# Patient Record
Sex: Female | Born: 1940 | Race: White | Hispanic: No | Smoking: Former smoker
Health system: Southern US, Community
[De-identification: ages and names within clinical notes are randomized; demographics above are authoritative.]

## PROBLEM LIST (undated history)

## (undated) DIAGNOSIS — H269 Unspecified cataract: Secondary | ICD-10-CM

## (undated) DIAGNOSIS — E559 Vitamin D deficiency, unspecified: Secondary | ICD-10-CM

## (undated) DIAGNOSIS — M069 Rheumatoid arthritis, unspecified: Secondary | ICD-10-CM

## (undated) DIAGNOSIS — R7303 Prediabetes: Secondary | ICD-10-CM

## (undated) DIAGNOSIS — E785 Hyperlipidemia, unspecified: Secondary | ICD-10-CM

## (undated) DIAGNOSIS — E079 Disorder of thyroid, unspecified: Secondary | ICD-10-CM

## (undated) DIAGNOSIS — I10 Essential (primary) hypertension: Secondary | ICD-10-CM

## (undated) DIAGNOSIS — T7840XA Allergy, unspecified, initial encounter: Secondary | ICD-10-CM

## (undated) HISTORY — PX: TUBAL LIGATION: SHX77

## (undated) HISTORY — DX: Essential (primary) hypertension: I10

## (undated) HISTORY — DX: Allergy, unspecified, initial encounter: T78.40XA

## (undated) HISTORY — DX: Unspecified cataract: H26.9

## (undated) HISTORY — DX: Disorder of thyroid, unspecified: E07.9

## (undated) HISTORY — DX: Prediabetes: R73.03

## (undated) HISTORY — PX: OTHER SURGICAL HISTORY: SHX169

## (undated) HISTORY — PX: CHOLECYSTECTOMY: SHX55

## (undated) HISTORY — DX: Vitamin D deficiency, unspecified: E55.9

## (undated) HISTORY — DX: Hyperlipidemia, unspecified: E78.5

## (undated) HISTORY — PX: APPENDECTOMY: SHX54

## (undated) HISTORY — DX: Rheumatoid arthritis, unspecified: M06.9

---

## 1997-10-27 ENCOUNTER — Other Ambulatory Visit: Admission: RE | Admit: 1997-10-27 | Discharge: 1997-10-27 | Payer: Self-pay | Admitting: *Deleted

## 1997-10-27 ENCOUNTER — Other Ambulatory Visit: Admission: RE | Admit: 1997-10-27 | Discharge: 1997-10-27 | Payer: Self-pay | Admitting: Internal Medicine

## 1997-11-16 ENCOUNTER — Ambulatory Visit (HOSPITAL_BASED_OUTPATIENT_CLINIC_OR_DEPARTMENT_OTHER): Admission: RE | Admit: 1997-11-16 | Discharge: 1997-11-16 | Payer: Self-pay | Admitting: Otolaryngology

## 1998-11-03 ENCOUNTER — Other Ambulatory Visit: Admission: RE | Admit: 1998-11-03 | Discharge: 1998-11-03 | Payer: Self-pay | Admitting: Internal Medicine

## 1998-11-10 ENCOUNTER — Ambulatory Visit (HOSPITAL_COMMUNITY): Admission: RE | Admit: 1998-11-10 | Discharge: 1998-11-10 | Payer: Self-pay | Admitting: Psychiatry

## 1999-04-08 ENCOUNTER — Other Ambulatory Visit: Admission: RE | Admit: 1999-04-08 | Discharge: 1999-04-08 | Payer: Self-pay | Admitting: Orthopedic Surgery

## 1999-11-21 ENCOUNTER — Other Ambulatory Visit: Admission: RE | Admit: 1999-11-21 | Discharge: 1999-11-21 | Payer: Self-pay | Admitting: *Deleted

## 2000-10-05 ENCOUNTER — Encounter: Payer: Self-pay | Admitting: Gastroenterology

## 2000-10-05 ENCOUNTER — Ambulatory Visit (HOSPITAL_COMMUNITY): Admission: RE | Admit: 2000-10-05 | Discharge: 2000-10-05 | Payer: Self-pay | Admitting: Gastroenterology

## 2000-10-08 ENCOUNTER — Encounter: Payer: Self-pay | Admitting: Surgery

## 2000-10-10 ENCOUNTER — Ambulatory Visit (HOSPITAL_COMMUNITY): Admission: RE | Admit: 2000-10-10 | Discharge: 2000-10-11 | Payer: Self-pay | Admitting: Surgery

## 2000-10-10 ENCOUNTER — Encounter: Payer: Self-pay | Admitting: Surgery

## 2000-10-10 ENCOUNTER — Encounter (INDEPENDENT_AMBULATORY_CARE_PROVIDER_SITE_OTHER): Payer: Self-pay | Admitting: *Deleted

## 2000-12-27 ENCOUNTER — Other Ambulatory Visit: Admission: RE | Admit: 2000-12-27 | Discharge: 2000-12-27 | Payer: Self-pay | Admitting: *Deleted

## 2001-12-31 ENCOUNTER — Encounter: Payer: Self-pay | Admitting: Internal Medicine

## 2001-12-31 ENCOUNTER — Other Ambulatory Visit: Admission: RE | Admit: 2001-12-31 | Discharge: 2001-12-31 | Payer: Self-pay | Admitting: Internal Medicine

## 2001-12-31 ENCOUNTER — Ambulatory Visit (HOSPITAL_COMMUNITY): Admission: RE | Admit: 2001-12-31 | Discharge: 2001-12-31 | Payer: Self-pay | Admitting: Internal Medicine

## 2002-05-29 HISTORY — PX: ANKLE ARTHROSCOPY WITH OPEN REDUCTION INTERNAL FIXATION (ORIF): SHX5582

## 2002-06-04 ENCOUNTER — Ambulatory Visit (HOSPITAL_COMMUNITY): Admission: RE | Admit: 2002-06-04 | Discharge: 2002-06-04 | Payer: Self-pay | Admitting: Internal Medicine

## 2002-06-04 ENCOUNTER — Encounter: Payer: Self-pay | Admitting: Internal Medicine

## 2003-03-09 ENCOUNTER — Ambulatory Visit (HOSPITAL_COMMUNITY): Admission: RE | Admit: 2003-03-09 | Discharge: 2003-03-10 | Payer: Self-pay | Admitting: Orthopedic Surgery

## 2003-03-09 ENCOUNTER — Encounter: Payer: Self-pay | Admitting: Orthopedic Surgery

## 2004-01-06 ENCOUNTER — Other Ambulatory Visit: Admission: RE | Admit: 2004-01-06 | Discharge: 2004-01-06 | Payer: Self-pay | Admitting: Internal Medicine

## 2005-05-29 HISTORY — PX: COCHLEAR IMPLANT: SUR684

## 2006-01-22 ENCOUNTER — Other Ambulatory Visit: Admission: RE | Admit: 2006-01-22 | Discharge: 2006-01-22 | Payer: Self-pay | Admitting: Family Medicine

## 2008-01-30 ENCOUNTER — Ambulatory Visit (HOSPITAL_COMMUNITY): Admission: RE | Admit: 2008-01-30 | Discharge: 2008-01-30 | Payer: Self-pay | Admitting: Internal Medicine

## 2008-03-04 ENCOUNTER — Ambulatory Visit (HOSPITAL_COMMUNITY): Admission: RE | Admit: 2008-03-04 | Discharge: 2008-03-04 | Payer: Self-pay | Admitting: Internal Medicine

## 2008-03-18 ENCOUNTER — Ambulatory Visit (HOSPITAL_COMMUNITY): Admission: RE | Admit: 2008-03-18 | Discharge: 2008-03-18 | Payer: Self-pay | Admitting: Internal Medicine

## 2008-05-29 LAB — HM COLONOSCOPY

## 2008-09-15 ENCOUNTER — Encounter: Admission: RE | Admit: 2008-09-15 | Discharge: 2008-09-15 | Payer: Self-pay | Admitting: Internal Medicine

## 2008-10-14 ENCOUNTER — Emergency Department (HOSPITAL_COMMUNITY): Admission: EM | Admit: 2008-10-14 | Discharge: 2008-10-15 | Payer: Self-pay | Admitting: Emergency Medicine

## 2008-10-20 ENCOUNTER — Encounter: Admission: RE | Admit: 2008-10-20 | Discharge: 2008-10-20 | Payer: Self-pay | Admitting: Orthopaedic Surgery

## 2008-10-23 ENCOUNTER — Encounter: Admission: RE | Admit: 2008-10-23 | Discharge: 2008-10-23 | Payer: Self-pay | Admitting: Orthopaedic Surgery

## 2008-10-27 ENCOUNTER — Emergency Department (HOSPITAL_COMMUNITY): Admission: EM | Admit: 2008-10-27 | Discharge: 2008-10-27 | Payer: Self-pay | Admitting: Emergency Medicine

## 2009-03-23 ENCOUNTER — Other Ambulatory Visit: Admission: RE | Admit: 2009-03-23 | Discharge: 2009-03-23 | Payer: Self-pay | Admitting: Internal Medicine

## 2009-03-23 ENCOUNTER — Ambulatory Visit (HOSPITAL_COMMUNITY): Admission: RE | Admit: 2009-03-23 | Discharge: 2009-03-23 | Payer: Self-pay | Admitting: Internal Medicine

## 2009-04-28 ENCOUNTER — Encounter: Admission: RE | Admit: 2009-04-28 | Discharge: 2009-04-28 | Payer: Self-pay | Admitting: Orthopedic Surgery

## 2010-02-01 ENCOUNTER — Ambulatory Visit (HOSPITAL_COMMUNITY): Admission: RE | Admit: 2010-02-01 | Discharge: 2010-02-01 | Payer: Self-pay | Admitting: Internal Medicine

## 2010-02-01 ENCOUNTER — Ambulatory Visit: Payer: Self-pay

## 2010-02-01 ENCOUNTER — Ambulatory Visit: Payer: Self-pay | Admitting: Cardiology

## 2010-02-01 ENCOUNTER — Encounter: Payer: Self-pay | Admitting: Internal Medicine

## 2010-03-30 ENCOUNTER — Inpatient Hospital Stay (HOSPITAL_COMMUNITY)
Admission: RE | Admit: 2010-03-30 | Discharge: 2010-03-31 | Payer: Self-pay | Source: Home / Self Care | Admitting: Obstetrics & Gynecology

## 2010-05-29 LAB — HM PAP SMEAR: HM Pap smear: NORMAL

## 2010-08-09 LAB — BASIC METABOLIC PANEL
BUN: 10 mg/dL (ref 6–23)
Chloride: 102 mEq/L (ref 96–112)
GFR calc Af Amer: >60 mL/min (ref >60)
GFR calc non Af Amer: >60 mL/min (ref >60)
Glucose, Bld: 126 mg/dL — ABNORMAL HIGH (ref 70–99)
Potassium: 3.6 mEq/L (ref 3.5–5.1)
Sodium: 137 mEq/L (ref 135–145)

## 2010-08-09 LAB — CBC: RBC: 3.31 MIL/uL — ABNORMAL LOW (ref 3.87–5.11)

## 2010-08-10 LAB — SURGICAL PCR SCREEN: MRSA, PCR: NEGATIVE

## 2010-08-10 LAB — CBC
HCT: 38.8 % (ref 36.0–46.0)
MCH: 30.9 pg (ref 26.0–34.0)
MCHC: 33.8 g/dL (ref 30.0–36.0)
MCV: 91.3 fL (ref 78.0–100.0)
Platelets: 290 10*3/uL (ref 150–400)
RDW: 16.7 % — ABNORMAL HIGH (ref 11.5–15.5)

## 2010-08-10 LAB — BASIC METABOLIC PANEL
BUN: 16 mg/dL (ref 6–23)
Calcium: 9.3 mg/dL (ref 8.4–10.5)
Chloride: 99 mEq/L (ref 96–112)
GFR calc Af Amer: >60 mL/min (ref >60)

## 2010-09-05 LAB — DIFFERENTIAL
Basophils Absolute: 0.1 10*3/uL (ref 0.0–0.1)
Basophils Relative: 1 % (ref 0–1)
Eosinophils Absolute: 0.1 10*3/uL (ref 0.0–0.7)
Lymphs Abs: 1.8 10*3/uL (ref 0.7–4.0)
Monocytes Absolute: 0.7 10*3/uL (ref 0.1–1.0)
Monocytes Relative: 6 % (ref 3–12)
Neutrophils Relative %: 77 % (ref 43–77)

## 2010-09-05 LAB — URINALYSIS, ROUTINE W REFLEX MICROSCOPIC
Bilirubin Urine: NEGATIVE
Glucose, UA: NEGATIVE mg/dL
Hgb urine dipstick: NEGATIVE
Protein, ur: NEGATIVE mg/dL
Specific Gravity, Urine: 1.016 (ref 1.005–1.030)

## 2010-09-05 LAB — COMPREHENSIVE METABOLIC PANEL
ALT: 23 U/L (ref 0–35)
Albumin: 2.3 g/dL — ABNORMAL LOW (ref 3.5–5.2)
Calcium: 9.2 mg/dL (ref 8.4–10.5)
Chloride: 94 mEq/L — ABNORMAL LOW (ref 96–112)
GFR calc Af Amer: >60 mL/min (ref >60)
Glucose, Bld: 115 mg/dL — ABNORMAL HIGH (ref 70–99)
Total Protein: 6.9 g/dL (ref 6.0–8.3)

## 2010-09-05 LAB — CBC
HCT: 32.3 % — ABNORMAL LOW (ref 36.0–46.0)
MCHC: 33.4 g/dL (ref 30.0–36.0)
Platelets: 777 10*3/uL — ABNORMAL HIGH (ref 150–400)

## 2010-09-06 LAB — POCT I-STAT, CHEM 8
BUN: 21 mg/dL (ref 6–23)
Calcium, Ion: 1.19 mmol/L (ref 1.12–1.32)
Chloride: 98 mEq/L (ref 96–112)
Glucose, Bld: 155 mg/dL — ABNORMAL HIGH (ref 70–99)
HCT: 33 % — ABNORMAL LOW (ref 36.0–46.0)
Potassium: 3.1 mEq/L — ABNORMAL LOW (ref 3.5–5.1)
Sodium: 138 mEq/L (ref 135–145)
TCO2: 31 mmol/L (ref 0–100)

## 2010-09-06 LAB — BASIC METABOLIC PANEL WITH GFR
BUN: 20 mg/dL (ref 6–23)
Chloride: 99 meq/L (ref 96–112)
GFR calc non Af Amer: >60 mL/min (ref >60)
Potassium: 3.1 meq/L — ABNORMAL LOW (ref 3.5–5.1)
Sodium: 138 meq/L (ref 135–145)

## 2010-09-06 LAB — CK: Total CK: 98 U/L (ref 7–177)

## 2010-09-06 LAB — BASIC METABOLIC PANEL
CO2: 30 mEq/L (ref 19–32)
Calcium: 9.3 mg/dL (ref 8.4–10.5)
Creatinine, Ser: 0.85 mg/dL (ref 0.4–1.2)
GFR calc Af Amer: >60 mL/min (ref >60)
Glucose, Bld: 170 mg/dL — ABNORMAL HIGH (ref 70–99)

## 2010-10-14 NOTE — Procedures (Signed)
[place]. [facility]  Patient:    Angelica Parker, Angelica Parker                       MRN: [id number] Proc. Date: 10/05/00 Adm. Date:  [id number] Disc. Date: [id number] Attending:  Felicita Gage CC:         Ammie Dalton, M.D.  Sandria Bales. Ezzard Standing, M.D.   Procedure Report  PROCEDURE:  Endoscopic retrograde cholangiopancreatography with sphincterotomy.  SURGEON:  John C. Madilyn Fireman, M.D.  INDICATIONS FOR PROCEDURE:  Gallstones with suspected common bile duct stones with recent bilirubin of 5.  DESCRIPTION OF PROCEDURE:  The patient was placed in the prone position and placed on the pulse monitor with continuous low flow oxygen delivered by nasal cannula.  She was sedated with 70 mg of IV Demerol and 7 mg of IV Versed.  The Olympus side viewing endoscope was advanced blindly into the oropharynx, esophagus, and stomach.  No obvious abnormalities were noted in the esophagus or stomach.  The pylorus was traversed and papilla of Vater located on the medial duodenal wall.  It had a normal appearance and was cannulated with a Montez Morita sphincterotome.  A cholangiogram was obtained with shallow cannulation and this revealed a fairly normal caliber common bile duct.  The guidewire passed easily up into the duct and this was followed with the sphincterotome.  Dr. Ronney Asters was in attendance and assisted in reviewing the cholangiogram.  No pancreatic duct injection was performed.  The gallbladder did fill and showed multiple small stones.  No obvious common bile duct filling defects were seen, but due to the small size of the gallstones, as well as elevated bilirubin, I decided to perform an empiric sphincterotomy.  After this was done, an 8.5 mm balloon catheter was advanced into the common hepatic duct, inflated, and pulled down under fluoroscopic and endoscopic visualization with no stones or stone fragments seen to be delivered.  The scope was then withdrawn and the patient  returned to the recovery room in stable condition.  She tolerated the procedure well and there were no immediate complications.  IMPRESSION:  Gallstones with no visible common bile duct stones at present, suspect previous stone passed.  PLAN:  Proceed to laparoscopic cholecystectomy. DD:  10/05/00 TD:  10/07/00 Job: [id number]

## 2010-10-14 NOTE — Op Note (Signed)
NAME:  Angelica Parker, Angelica Parker                          ACCOUNT NO.:  [id number]   MEDICAL RECORD NO.:  [id number]                   PATIENT TYPE:  OIB   LOCATION:  2853                                 FACILITY:  [facility]   PHYSICIAN:  Burnard Bunting, M.D.                 DATE OF BIRTH:  [birth year]   DATE OF PROCEDURE:  03/09/2003  DATE OF DISCHARGE:                                 OPERATIVE REPORT   PREOPERATIVE DIAGNOSIS:  Right trimalleolar ankle fracture.   POSTOPERATIVE DIAGNOSIS:  Right trimalleolar ankle fracture.   PROCEDURE:  Right ankle open reduction internal fixation without fixation of  the posterior malleolar fracture.   SURGEON:  Burnard Bunting, M.D.   ANESTHESIA:  General endotracheal.   ESTIMATED BLOOD LOSS:  25 cc.   DRAINS:  None.   ANKLE ESMARCH:  50 minutes.   PROCEDURE IN DETAIL:  The patient was brought to the operating room where  general endotracheal anesthesia was induced.  Preoperative IV antibiotics  were administered.  The right leg was prepped with Duraprep solution and  draped in a sterile manner.  The operative field was covered with Ioban.  Ankle Esmarch was utilized at the mid calf level.  A lateral approach to the  lateral malleolus was made, stopped short of the 9 cm mark proximal to the  distal tip of the fibula.  The skin and subcutaneous tissue were sharply  divided.  The periosteum was elevated from around the fracture site.  The  fracture was reduced under direct visualization.  A lag screw was placed  from proximal anterior to distal posterior.  Good reduction and compression  of the oblique fracture was achieved.  A six hole one-third tubular locking  plate was then applied with excellent stabilization achieved.  At this time,  the medial side was approached with a 4 cm incision.  The skin and  subcutaneous tissue was sharply divided.  The fracture site was essentially  nondisplaced.  It was held in compression with a dental pick.  Two 50 mm  4.0  mm cannulated screws were then placed.  Excellent fixation and compression  was achieved.  Reduction was viewed in AP and lateral planes under  fluoroscopy.  The syndesmosis was then tested with lateral force against the  calcaneus and under fluoroscopic inspection, the syndesmosis was stable at  this time.  The tourniquet was released.  Bleeding points were encountered  and stopped using electrocautery.  Both incisions were irrigated and closed  using interrupted inverted 2-0 Vicryl suture and 3-0 nylon suture.  The  patient was placed in a bulky posterior splint with the foot in neutral  dorsiflexion.  The heel was well padded.  Burnard Bunting, M.D.    GSD/MEDQ  D:  03/09/2003  T:  03/10/2003  Job:  [id number]

## 2010-10-14 NOTE — Op Note (Signed)
[place]. [facility]  Patient:    Angelica Parker, Angelica Parker                      MRN: [id number] Proc. Date: 10/10/00 Attending:  Sandria Bales. Ezzard Standing, M.D. CC:         Ammie Dalton, M.D.  John C. Madilyn Fireman, M.D.   Operative Report  DATE OF BIRTH:  [birth year]  PREOPERATIVE DIAGNOSIS:  Chronic cholecystitis and cholelithiasis.  POSTOPERATIVE DIAGNOSIS:  Chronic cholecystitis with cholelithiasis.  OPERATION:  Laparoscopic cholecystectomy with intraoperative cholangiogram.  SURGEON:  Sandria Bales. Ezzard Standing, M.D.  ASSISTANT:  Donnie Coffin. Samuella Cota, M.D.  ANESTHESIA:  General endotracheal.  ESTIMATED BLOOD LOSS:  Minimal.  INDICATIONS:  Ms. Tirone is a 70 year old white female, patient of Dr. Ammie Dalton who presented with evidence of cholelithiasis and chronic cholecystitis by both ultrasound and physical exam.  She also is noted to have elevated liver functions with a bilirubin as high as 5.2.  I referred her to Dr. Dorena Cookey who did an ERCP on Friday, Oct 05, 2000, which is reported as no obvious common bile duct stones.  She now comes for laparoscopic cholecystectomy.  The indications and complications of the procedure were explained to the patient.  DESCRIPTION OF PROCEDURE:  The patient was placed in the supine position.  She was given a general endotracheal anesthetic.  Her abdomen was prepped with Betadine solution and sterilely draped.  An infraumbilical incision was made with sharp dissection carried down into the abdominal cavity.  A 0 degree 10 mm laparoscope was inserted into the abdominal cavity through a 12 mm Hasson trocar.  The Hasson trocar was secured with a 0 Vicryl suture. The abdomen was insufflated with CO2 to about 4 liters.  Abdominal exploration revealed mainly just omentum which covered the entire abdominal cavity.  The right and left lobes of the liver were unremarkable.  The stomach was unremarkable.  I really could not see any  other abdominal structures, but there was no nodularity or other mass noted.  Three additional trocars were placed at 10 mm, subxiphoid, trocar and two 5 mm trocars, one in the right mid subcostal location and one in the right lateral subcostal location.  The gallbladder was identified and noted to have adhesions from the omentum and duodenum up the entire length of the gallbladder.  These were taken down bluntly and taken without any difficulty. I got down to the gallbladder cystic duct junction.  The patient had a prominent large cystic duct of which I was able to tease away.  I identified the length, probably about 3-4 cm in length.  I then shot an intraoperative cholangiogram.  Intraoperative cholangiogram was obtained using a cut off taut catheter inserted through a 14 gauge Jelco catheter into the abdominal cavity.  The taut catheter was inserted into the side of the cut cystic duct, and the intraoperative cholangiogram obtained under fluoroscopy.  Using about 8 cc of Hypaque solution, I injected Hypaque through the taut catheter into the cystic duct.  It flowed freely into the common bile duct, down to the duodenum and back up into the liver.  There was no obstruction, no mass and no filling defect.  This was felt to be a normal intraoperative cholangiogram.  The cystic duct catheter was then removed.  The cystic artery had been identified, triply endoclipped and divided.  The gallbladder was then sharply and bluntly dissected from the gallbladder bed.  Prior to complete division  of the gallbladder from the gallbladder bed, I went back and looked at the triangle of Calot and the gallbladder bed.  There was no bleeding, no bile leak.  The gallbladder was divided from the liver and delivered through the umbilicus intact.  I opened it for a stone to return to patient.  I then removed each trocar was then removed under direct visualization.  The umbilical trocar site was closed  with a 0 Vicryl suture.  The epitrochlear sites were note closed.  They had no bleeding and no leak.  The skin incision site was closed with 5-0 Vicryl suture.  The skin was then painted with tincture of Benzoin, steri-stripped and sterilely dressed.  The patient tolerated the procedure well and was transported to the recovery room in stable good condition.  The patient did have several stones packed in the cystic duct which I identified, milked out and retrieved and the cystic duct was somewhat enlarged but again, the intraoperative cholangiogram was unremarkable. DD:  10/10/00 TD:  10/10/00 Job: [id number]

## 2011-03-17 DIAGNOSIS — H9193 Unspecified hearing loss, bilateral: Secondary | ICD-10-CM | POA: Insufficient documentation

## 2012-01-12 DIAGNOSIS — H906 Mixed conductive and sensorineural hearing loss, bilateral: Secondary | ICD-10-CM | POA: Insufficient documentation

## 2012-05-28 ENCOUNTER — Other Ambulatory Visit: Payer: Self-pay

## 2013-04-24 ENCOUNTER — Encounter: Payer: Self-pay | Admitting: Internal Medicine

## 2013-04-24 DIAGNOSIS — E559 Vitamin D deficiency, unspecified: Secondary | ICD-10-CM | POA: Insufficient documentation

## 2013-04-24 DIAGNOSIS — I10 Essential (primary) hypertension: Secondary | ICD-10-CM | POA: Insufficient documentation

## 2013-04-24 DIAGNOSIS — E039 Hypothyroidism, unspecified: Secondary | ICD-10-CM | POA: Insufficient documentation

## 2013-04-24 DIAGNOSIS — M069 Rheumatoid arthritis, unspecified: Secondary | ICD-10-CM | POA: Insufficient documentation

## 2013-04-24 DIAGNOSIS — R7303 Prediabetes: Secondary | ICD-10-CM | POA: Insufficient documentation

## 2013-04-24 DIAGNOSIS — E785 Hyperlipidemia, unspecified: Secondary | ICD-10-CM | POA: Insufficient documentation

## 2013-04-28 ENCOUNTER — Encounter: Payer: Self-pay | Admitting: Physician Assistant

## 2013-04-28 ENCOUNTER — Ambulatory Visit (INDEPENDENT_AMBULATORY_CARE_PROVIDER_SITE_OTHER): Payer: 59 | Admitting: Physician Assistant

## 2013-04-28 VITALS — BP 108/78 | HR 84 | Temp 98.2°F | Resp 16 | Ht 66.5 in | Wt 202.0 lb

## 2013-04-28 DIAGNOSIS — I10 Essential (primary) hypertension: Secondary | ICD-10-CM

## 2013-04-28 DIAGNOSIS — E559 Vitamin D deficiency, unspecified: Secondary | ICD-10-CM

## 2013-04-28 DIAGNOSIS — E079 Disorder of thyroid, unspecified: Secondary | ICD-10-CM

## 2013-04-28 DIAGNOSIS — N3000 Acute cystitis without hematuria: Secondary | ICD-10-CM

## 2013-04-28 DIAGNOSIS — E785 Hyperlipidemia, unspecified: Secondary | ICD-10-CM

## 2013-04-28 DIAGNOSIS — E119 Type 2 diabetes mellitus without complications: Secondary | ICD-10-CM

## 2013-04-28 LAB — CBC WITH DIFFERENTIAL/PLATELET
Basophils Absolute: 0.1 K/uL (ref 0.0–0.1)
Basophils Relative: 1 % (ref 0–1)
Eosinophils Absolute: 0.3 K/uL (ref 0.0–0.7)
Eosinophils Relative: 5 % (ref 0–5)
HCT: 39.4 % (ref 36.0–46.0)
Hemoglobin: 13.5 g/dL (ref 12.0–15.0)
Lymphocytes Relative: 46 % (ref 12–46)
Lymphs Abs: 2.6 K/uL (ref 0.7–4.0)
MCH: 32 pg (ref 26.0–34.0)
MCHC: 34.3 g/dL (ref 30.0–36.0)
MCV: 93.4 fL (ref 78.0–100.0)
Monocytes Absolute: 0.4 K/uL (ref 0.1–1.0)
Monocytes Relative: 7 % (ref 3–12)
Neutro Abs: 2.3 K/uL (ref 1.7–7.7)
Neutrophils Relative %: 41 % — ABNORMAL LOW (ref 43–77)
Platelets: 285 K/uL (ref 150–400)
RBC: 4.22 MIL/uL (ref 3.87–5.11)
RDW: 15.5 % (ref 11.5–15.5)
WBC: 5.7 K/uL (ref 4.0–10.5)

## 2013-04-28 LAB — BASIC METABOLIC PANEL WITHOUT GFR
BUN: 15 mg/dL (ref 6–23)
CO2: 30 meq/L (ref 19–32)
Calcium: 9.6 mg/dL (ref 8.4–10.5)
Chloride: 102 meq/L (ref 96–112)
Creat: 0.74 mg/dL (ref 0.50–1.10)
GFR, Est African American: >89 mL/min
GFR, Est Non African American: 81 mL/min
Glucose, Bld: 103 mg/dL — ABNORMAL HIGH (ref 70–99)
Potassium: 4.9 meq/L (ref 3.5–5.3)
Sodium: 139 meq/L (ref 135–145)

## 2013-04-28 LAB — HEMOGLOBIN A1C
Hgb A1c MFr Bld: 6.1 % — ABNORMAL HIGH (ref <5.7)
Mean Plasma Glucose: 128 mg/dL — ABNORMAL HIGH (ref <117)

## 2013-04-28 LAB — LIPID PANEL
Cholesterol: 169 mg/dL (ref 0–200)
HDL: 45 mg/dL
LDL Cholesterol: 95 mg/dL (ref 0–99)
Total CHOL/HDL Ratio: 3.8 ratio
Triglycerides: 143 mg/dL
VLDL: 29 mg/dL (ref 0–40)

## 2013-04-28 LAB — HEPATIC FUNCTION PANEL
ALT: 32 U/L (ref 0–35)
AST: 22 U/L (ref 0–37)
Albumin: 3.9 g/dL (ref 3.5–5.2)
Alkaline Phosphatase: 80 U/L (ref 39–117)
Total Bilirubin: 0.6 mg/dL (ref 0.3–1.2)
Total Protein: 6.5 g/dL (ref 6.0–8.3)

## 2013-04-28 LAB — TSH: TSH: 0.352 u[IU]/mL (ref 0.350–4.500)

## 2013-04-28 MED ORDER — CLOTRIMAZOLE 1 % VA CREA: 1.0000 | TOPICAL_CREAM | Freq: Every day | VAGINAL | Status: DC

## 2013-04-28 NOTE — Progress Notes (Signed)
Complete Physical HPI Patient presents for complete physical.   Patient's blood pressure has been controlled at home. Patient denies chest pain, shortness of breath, dizziness. Patient's cholesterol is diet controlled.  The patient's cholesterol last visit was LDL 97.  The patient has been working on diet and exercise for prediabetes, denies changes in vision, polys, and paresthesias. Last A1C in office was 6.2. Vitamin d last visit was 65. Patient sees Dr. Dareen Piano and states her RA is doing well.  Patient complains of vaginal itching mainly at night, no burning when she pees and no discharge.   Current Medications:  Current Outpatient Prescriptions on File Prior to Visit  Medication Sig Dispense Refill  . adalimumab (HUMIRA) 40 MG/0.8ML injection Inject 40 mg into the skin every 14 (fourteen) days.      Marland Kitchen aspirin 81 MG chewable tablet Chew by mouth daily.      . bisoprolol-hydrochlorothiazide (ZIAC) 5-6.25 MG per tablet Take 1 tablet by mouth daily.      . calcium carbonate (OS-CAL) 600 MG TABS tablet Take 600 mg by mouth 2 (two) times daily with a meal.      . Cholecalciferol (VITAMIN D PO) Take 4,000 Int'l Units by mouth daily.      . citalopram (CELEXA) 40 MG tablet Take 40 mg by mouth daily.      . folic acid (FOLVITE) 1 MG tablet Take by mouth daily.      Marland Kitchen levothyroxine (SYNTHROID, LEVOTHROID) 112 MCG tablet Take 112 mcg by mouth daily before breakfast.      . losartan (COZAAR) 100 MG tablet Take 100 mg by mouth daily.      . Magnesium 400 MG CAPS Take 400 mg by mouth 2 (two) times daily.      . methotrexate (RHEUMATREX) 2.5 MG tablet Take 2.5 mg by mouth.      . Multiple Vitamins-Minerals (MULTIVITAMIN PO) Take by mouth daily.      . Omega-3 Fatty Acids (FISH OIL PO) Take by mouth daily.       No current facility-administered medications on file prior to visit.   Health Maintenance:  Tetanus: 2007 Pneumovax: Allergy Flu vaccine: 02/04/2013 Zostavax:2008 Pap: 2012 neg MGM:  03/05/2013 neg DEXA: 2013 nl Colonoscopy: 2010 + tics due 2020  EGD: N/A Allergies:  Allergies  Allergen Reactions  . Ace Inhibitors Cough  . Pneumococcal Vaccines    Medical History:  Past Medical History  Diagnosis Date  . Hyperlipidemia   . Hypertension   . Prediabetes   . Thyroid disease   . Vitamin D deficiency   . Allergy   . Rheumatoid arthritis    Surgical History:  Past Surgical History  Procedure Laterality Date  . Cochlear implant Right 2007  . Appendectomy    . Tubal ligation Bilateral   . Cholecystectomy    . Other surgical history      4 ear surgeries  . Ankle arthroscopy with open reduction internal fixation (orif) Right 2004  . Other surgical history      Right temple bone surgery   Family History:  Family History  Problem Relation Age of Onset  . Heart attack Mother   . Hypertension Mother   . Lymphoma Mother    Social History:  History   Social History  . Marital Status: Widowed    Spouse Name: N/A    Number of Children: N/A  . Years of Education: N/A   Occupational History  . Not on file.   Social History Main  Topics  . Smoking status: Former Games developer  . Smokeless tobacco: Never Used  . Alcohol Use: Not on file  . Drug Use: No  . Sexual Activity: Not on file   Other Topics Concern  . Not on file   Social History Narrative  . No narrative on file   ROS Constitutional: Denies weight loss/gain, headaches, insomnia, fatigue, night sweats, and change in appetite. Eyes: Denies redness, blurred vision, diplopia, discharge, itchy, watery eyes.  ENT: Denies discharge, congestion, post nasal drip, sore throat, earache, hearing loss, dental pain, Tinnitus, Vertigo, Sinus pain, snoring.  Cardio: Denies chest pain, palpitations, irregular heartbeat, dyspnea, diaphoresis, orthopnea, PND, claudication, edema Respiratory: denies cough, dyspnea, pleurisy, hoarseness, wheezing.  Gastrointestinal: Occ diarrhea is goes out to eat Denies dysphagia,  heartburn, pain, cramps, nausea, vomiting, bloating, diarrhea, constipation, hematemesis, melena, hematochezia, hemorrhoids Genitourinary: + vaginal itching Denies dysuria, frequency, urgency, nocturia, hesitancy, discharge, hematuria, flank pain Breast:Denies Breast lumps, nipple discharge, bleeding.  Musculoskeletal: Denies arthralgia, myalgia, stiffness, Jt. Swelling, pain, Skin: Denies pruritis, rash, hives,  acne, eczema, changing in skin lesion Neuro: Denies Weakness, tremor, incoordination, spasms, paresthesia, pain Psychiatric: Denies confusion, memory loss, sensory loss Endocrine: Denies change in weight, skin, hair change, nocturia, and paresthesia, Diabetic Denies Polys, visual blurring, hyper /hypo glycemic episodes.  Heme/Lymph: Denies Excessive bleeding, bruising, enlarged lymph nodes  Physical Exam: Estimated body mass index is 32.12 kg/(m^2) as calculated from the following:   Height as of this encounter: 5' 6.5" (1.689 m).   Weight as of this encounter: 202 lb (91.627 kg). Filed Vitals:   04/28/13 1011  BP: 108/78  Pulse: 84  Temp: 98.2 F (36.8 C)  Resp: 16   General Appearance: Well nourished, in no apparent distress. Eyes: PERRLA, EOMs, conjunctiva no swelling or erythema, normal fundi and vessels. Sinuses: No Frontal/maxillary tenderness ENT/Mouth: Ext aud canals clear, normal light reflex with TMs without erythema, bulging on Left side and right side has scarred TM. + Dentures. No erythema, swelling, or exudate on post pharynx. Tonsils not swollen or erythematous. 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 No bruits Respiratory: Respiratory effort normal, BS equal bilaterally without rales, rhonchi, wheezing or stridor. Cardio: Heart sounds normal, regular rate and rhythm without murmurs, rubs or gallops. Peripheral pulses brisk and equal bilaterally, without edema.  Chest: symmetric, with normal excursions and  percussion. Breasts: defer  Abdomen: Flat, soft, with bowl sounds. Non tender, no guarding, rebound, hernias, masses, or organomegaly. .  Lymphatics: Non tender without lymphadenopathy.  Genitourinary: defer Musculoskeletal: Full ROM all peripheral extremities,5/5 strength, and normal gait. Skin: Warm, dry without rashes, lesions, ecchymosis.  Neuro: Cranial nerves intact, reflexes equal bilaterally. Normal muscle tone, no cerebellar symptoms. Sensation intact.  Psych: Awake and oriented X 3, normal affect, Insight and Judgment appropriate.   EKG: WNL no changes.  Assessment and Plan: Marland Kitchen Hyperlipidemia- check lipids  . Hypertension- check microalbumin and cont meds  . Prediabetes/DM- continue weight loss  . Thyroid disease- check level continue meds  . Vitamin D deficiency- continue Vitamin D supplement  . Allergy- controlled  . Rheumatoid arthritis- Sees Dr. Dareen Piano Thursday  Depression- on citalopram and feels it helps Recent UTI- will recheck urine. Vaginitis- clotrimazole vaginal sent in, if it continues or does not help schedule a separate OV.    Quentin Mulling 10:16 AM

## 2013-04-29 LAB — URINALYSIS, ROUTINE W REFLEX MICROSCOPIC
Bilirubin Urine: NEGATIVE
Glucose, UA: NEGATIVE mg/dL
Nitrite: NEGATIVE
Protein, ur: NEGATIVE mg/dL
Specific Gravity, Urine: 1.017 (ref 1.005–1.030)
Urobilinogen, UA: 0.2 mg/dL (ref 0.0–1.0)

## 2013-04-29 LAB — MICROALBUMIN / CREATININE URINE RATIO
Creatinine, Urine: 106.5 mg/dL
Microalb, Ur: 0.5 mg/dL (ref 0.00–1.89)

## 2013-04-29 LAB — URINE CULTURE
Colony Count: NO GROWTH
Organism ID, Bacteria: NO GROWTH

## 2013-07-17 ENCOUNTER — Other Ambulatory Visit: Payer: Self-pay | Admitting: Internal Medicine

## 2013-07-17 ENCOUNTER — Other Ambulatory Visit: Payer: Self-pay | Admitting: Physician Assistant

## 2013-07-31 ENCOUNTER — Encounter: Payer: Self-pay | Admitting: Internal Medicine

## 2013-07-31 ENCOUNTER — Ambulatory Visit (INDEPENDENT_AMBULATORY_CARE_PROVIDER_SITE_OTHER): Payer: Medicare Other | Admitting: Internal Medicine

## 2013-07-31 VITALS — BP 128/72 | HR 84 | Temp 98.4°F | Resp 16 | Wt 203.2 lb

## 2013-07-31 DIAGNOSIS — Z79899 Other long term (current) drug therapy: Secondary | ICD-10-CM | POA: Insufficient documentation

## 2013-07-31 DIAGNOSIS — E1129 Type 2 diabetes mellitus with other diabetic kidney complication: Secondary | ICD-10-CM

## 2013-07-31 DIAGNOSIS — E785 Hyperlipidemia, unspecified: Secondary | ICD-10-CM

## 2013-07-31 DIAGNOSIS — I10 Essential (primary) hypertension: Secondary | ICD-10-CM

## 2013-07-31 DIAGNOSIS — E559 Vitamin D deficiency, unspecified: Secondary | ICD-10-CM

## 2013-07-31 LAB — CBC WITH DIFFERENTIAL/PLATELET
Basophils Absolute: 0.1 10*3/uL (ref 0.0–0.1)
Basophils Relative: 1 % (ref 0–1)
EOS ABS: 0.4 10*3/uL (ref 0.0–0.7)
EOS PCT: 6 % — AB (ref 0–5)
HCT: 40.8 % (ref 36.0–46.0)
Hemoglobin: 13.8 g/dL (ref 12.0–15.0)
LYMPHS ABS: 3 10*3/uL (ref 0.7–4.0)
Lymphocytes Relative: 42 % (ref 12–46)
MCH: 30.7 pg (ref 26.0–34.0)
MCHC: 33.8 g/dL (ref 30.0–36.0)
MCV: 90.9 fL (ref 78.0–100.0)
Monocytes Absolute: 0.4 10*3/uL (ref 0.1–1.0)
Monocytes Relative: 6 % (ref 3–12)
Neutro Abs: 3.2 10*3/uL (ref 1.7–7.7)
Neutrophils Relative %: 45 % (ref 43–77)
PLATELETS: 328 10*3/uL (ref 150–400)
RBC: 4.49 MIL/uL (ref 3.87–5.11)
RDW: 15.3 % (ref 11.5–15.5)
WBC: 7.2 10*3/uL (ref 4.0–10.5)

## 2013-07-31 NOTE — Progress Notes (Signed)
Patient ID: Angelica Parker, female   DOB: December 19, 1940, 73 y.o.   MRN: [id number]    This very nice 73 y.o. female presents for 3 month follow up with Hypertension, Hyperlipidemia, Pre-Diabetes and Vitamin D Deficiency.    HTN predates since   . BP has been controlled at home. Today's BP: 128/72 mmHg . Patient denies any cardiac type chest pain, palpitations, dyspnea/orthopnea/PND, dizziness, claudication, or dependent edema.   Hyperlipidemia is controlled with diet & meds. Last Cholesterol was169, Triglycerides were 143, HDL 45 and LDL 95 in Dec 67672 - all at goal. Patient denies myalgias or other med SE's.    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 Patient denies any symptoms of reactive hypoglycemia, diabetic polys, paresthesias or visual blurring.   In addition, patient has Rheumatoid Arthritis and seems relatively asymptomatic and controlled on MTX and Humira managed by Dr Azzie Roup.   Further, patient has history of Vitamin D Deficiency with last vitamin D of 64 in Dec 2014. Patient supplements vitamin D without any suspected side-effects.    Medication List       ADVAIR DISKUS 100-50 MCG/DOSE Aepb  Generic drug:  Fluticasone-Salmeterol     aspirin 81 MG chewable tablet  Chew by mouth daily.     calcium carbonate 600 MG Tabs tablet  Commonly known as:  OS-CAL  Take 600 mg by mouth 2 (two) times daily with a meal.     citalopram 40 MG tablet  Commonly known as:  CELEXA  take 1 tablet by mouth once daily FOR MOOD     clotrimazole 1 % vaginal cream  Commonly known as:  GYNE-LOTRIMIN  Place 1 Applicatorful vaginally at bedtime.     FISH OIL PO  Take by mouth 2 (two) times daily.     folic acid 1 MG tablet  Commonly known as:  FOLVITE  Take by mouth daily.     HUMIRA 40 MG/0.8ML injection  Generic drug:  adalimumab  Inject 40 mg into the skin every 14 (fourteen) days.     hydrochlorothiazide  25 MG tablet  Commonly known as:  HYDRODIURIL     levothyroxine 112 MCG tablet  Commonly known as:  SYNTHROID, LEVOTHROID  Take 112 mcg by mouth daily before breakfast.     losartan 100 MG tablet  Commonly known as:  COZAAR  take 1 tablet by mouth once daily     Magnesium 400 MG Caps  Take 400 mg by mouth 2 (two) times daily.     methotrexate 2.5 MG tablet  Commonly known as:  RHEUMATREX     MULTIVITAMIN PO  Take by mouth daily.     VITAMIN D PO  Take 4,000 Int'l Units by mouth daily.     ZIAC 5-6.25 MG per tablet  Generic drug:  bisoprolol-hydrochlorothiazide  Take 1 tablet by mouth daily.         Allergies  Allergen Reactions  . Ace Inhibitors Cough  . Pneumococcal Vaccines     PMHx:   Past Medical History  Diagnosis Date  . Hyperlipidemia   . Hypertension   . Prediabetes   . Thyroid disease   . Vitamin D deficiency   . Allergy   . Rheumatoid arthritis     FHx:    Reviewed / unchanged  SHx:    Reviewed / unchanged  Systems Review: Constitutional: Denies fever, chills, wt changes, headaches, insomnia,  fatigue, night sweats, change in appetite. Eyes: Denies redness, blurred vision, diplopia, discharge, itchy, watery eyes.  ENT: Denies discharge, congestion, post nasal drip, epistaxis, sore throat, earache, hearing loss, dental pain, tinnitus, vertigo, sinus pain, snoring.  CV: Denies chest pain, palpitations, irregular heartbeat, syncope, dyspnea, diaphoresis, orthopnea, PND, claudication, edema. Respiratory: denies cough, dyspnea, DOE, pleurisy, hoarseness, laryngitis, wheezing.  Gastrointestinal: Denies dysphagia, odynophagia, heartburn, reflux, water brash, abdominal pain or cramps, nausea, vomiting, bloating, diarrhea, constipation, hematemesis, melena, hematochezia,  or hemorrhoids. Genitourinary: Denies dysuria, frequency, urgency, nocturia, hesitancy, discharge, hematuria, flank pain. Musculoskeletal: Denies arthralgias, myalgias, stiffness, jt.  swelling, pain, limp, strain/sprain.  Skin: Denies pruritus, rash, hives, warts, acne, eczema, change in skin lesion(s). Neuro: No weakness, tremor, incoordination, spasms, paresthesia, or pain. Psychiatric: Denies confusion, memory loss, or sensory loss. Endo: Denies change in weight, skin, hair change.  Heme/Lymph: No excessive bleeding, bruising, orenlarged lymph nodes.  BP: 128/72  Pulse: 84  Temp: 98.4 F (36.9 C)  Resp: 16    Estimated body mass index is 32.31 kg/(m^2) as calculated from the following:   Height as of 04/28/13: 5' 6.5" (1.689 m).   Weight as of this encounter: 203 lb 3.2 oz (92.171 kg).  On Exam: Appears well nourished - in no distress. Eyes: PERRLA, EOMs, conjunctiva no swelling or erythema. Sinuses: No frontal/maxillary tenderness ENT/Mouth: EAC's clear, TM's nl w/o erythema, bulging. Nares clear w/o erythema, swelling, exudates. Oropharynx clear without erythema or exudates. Oral hygiene is good. Tongue normal, non obstructing. Hearing intact.  Neck: Supple. Thyroid nl. Car 2+/2+ without bruits, nodes or JVD. Chest: Respirations nl with BS clear & equal w/o rales, rhonchi, wheezing or stridor.  Cor: Heart sounds normal w/ regular rate and rhythm without sig. murmurs, gallops, clicks, or rubs. Peripheral pulses normal and equal  without edema.  Abdomen: Soft & bowel sounds normal. Non-tender w/o guarding, rebound, hernias, masses, or organomegaly.  Lymphatics: Unremarkable.  Musculoskeletal: Full ROM all peripheral extremities, joint stability, 5/5 strength, and normal gait.  Skin: Warm, dry without exposed rashes, lesions, ecchymosis apparent.  Neuro: Cranial nerves intact, reflexes equal bilaterally. Sensory-motor testing grossly intact. Tendon reflexes grossly intact.  Pysch: Alert & oriented x 3. Insight and judgement nl & appropriate. No ideations.  Assessment and Plan:  1. Hypertension - Continue monitor blood pressure at home. Continue diet/meds  same.  2. Hyperlipidemia - Continue diet/meds, exercise,& lifestyle modifications. Continue monitor periodic cholesterol/liver & renal functions   3. 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  4. Vitamin D Deficiency - Continue supplementation.  5. Rheumatoid Arthritis  6. Obesity (BMI 33.5)  7. Hypothyroidism  Recommended regular exercise, BP monitoring, weight control, and discussed med and SE's. Recommended labs to assess and monitor clinical status. Further disposition pending results of labs.

## 2013-07-31 NOTE — Patient Instructions (Signed)

## 2013-08-01 LAB — LIPID PANEL
CHOLESTEROL: 169 mg/dL (ref 0–200)
HDL: 53 mg/dL (ref >39)
LDL Cholesterol: 95 mg/dL (ref 0–99)
Total CHOL/HDL Ratio: 3.2 Ratio
Triglycerides: 105 mg/dL (ref <150)
VLDL: 21 mg/dL (ref 0–40)

## 2013-08-01 LAB — BASIC METABOLIC PANEL WITH GFR
BUN: 16 mg/dL (ref 6–23)
CALCIUM: 9.9 mg/dL (ref 8.4–10.5)
CO2: 30 meq/L (ref 19–32)
Chloride: 97 mEq/L (ref 96–112)
Creat: 0.79 mg/dL (ref 0.50–1.10)
GFR, Est African American: 85 mL/min
GFR, Est Non African American: 74 mL/min
GLUCOSE: 100 mg/dL — AB (ref 70–99)
Potassium: 4.6 mEq/L (ref 3.5–5.3)
Sodium: 138 mEq/L (ref 135–145)

## 2013-08-01 LAB — VITAMIN D 25 HYDROXY (VIT D DEFICIENCY, FRACTURES): VIT D 25 HYDROXY: 83 ng/mL (ref 30–89)

## 2013-08-01 LAB — HEMOGLOBIN A1C
Hgb A1c MFr Bld: 6.1 % — ABNORMAL HIGH (ref <5.7)
MEAN PLASMA GLUCOSE: 128 mg/dL — AB (ref <117)

## 2013-08-01 LAB — MAGNESIUM: MAGNESIUM: 1.9 mg/dL (ref 1.5–2.5)

## 2013-08-01 LAB — HEPATIC FUNCTION PANEL
ALBUMIN: 4.1 g/dL (ref 3.5–5.2)
ALT: 23 U/L (ref 0–35)
AST: 17 U/L (ref 0–37)
Alkaline Phosphatase: 89 U/L (ref 39–117)
BILIRUBIN TOTAL: 0.6 mg/dL (ref 0.2–1.2)
Bilirubin, Direct: 0.1 mg/dL (ref 0.0–0.3)
Indirect Bilirubin: 0.5 mg/dL (ref 0.2–1.2)
Total Protein: 7 g/dL (ref 6.0–8.3)

## 2013-08-01 LAB — INSULIN, FASTING: Insulin fasting, serum: 19 u[IU]/mL (ref 3–28)

## 2013-08-01 LAB — TSH: TSH: 0.802 u[IU]/mL (ref 0.350–4.500)

## 2013-11-03 ENCOUNTER — Ambulatory Visit (INDEPENDENT_AMBULATORY_CARE_PROVIDER_SITE_OTHER): Payer: Medicare Other | Admitting: Physician Assistant

## 2013-11-03 ENCOUNTER — Encounter: Payer: Self-pay | Admitting: Physician Assistant

## 2013-11-03 VITALS — BP 122/62 | HR 76 | Temp 97.9°F | Resp 16 | Ht 66.5 in | Wt 199.0 lb

## 2013-11-03 DIAGNOSIS — Z789 Other specified health status: Secondary | ICD-10-CM

## 2013-11-03 DIAGNOSIS — E039 Hypothyroidism, unspecified: Secondary | ICD-10-CM

## 2013-11-03 DIAGNOSIS — Z1331 Encounter for screening for depression: Secondary | ICD-10-CM

## 2013-11-03 DIAGNOSIS — Z0000 Encounter for general adult medical examination without abnormal findings: Secondary | ICD-10-CM

## 2013-11-03 DIAGNOSIS — E559 Vitamin D deficiency, unspecified: Secondary | ICD-10-CM

## 2013-11-03 DIAGNOSIS — M109 Gout, unspecified: Secondary | ICD-10-CM

## 2013-11-03 DIAGNOSIS — M069 Rheumatoid arthritis, unspecified: Secondary | ICD-10-CM

## 2013-11-03 DIAGNOSIS — E1129 Type 2 diabetes mellitus with other diabetic kidney complication: Secondary | ICD-10-CM

## 2013-11-03 DIAGNOSIS — E785 Hyperlipidemia, unspecified: Secondary | ICD-10-CM

## 2013-11-03 DIAGNOSIS — I10 Essential (primary) hypertension: Secondary | ICD-10-CM

## 2013-11-03 DIAGNOSIS — Z79899 Other long term (current) drug therapy: Secondary | ICD-10-CM

## 2013-11-03 LAB — HEPATIC FUNCTION PANEL
ALBUMIN: 4.1 g/dL (ref 3.5–5.2)
ALT: 27 U/L (ref 0–35)
AST: 18 U/L (ref 0–37)
Alkaline Phosphatase: 81 U/L (ref 39–117)
Bilirubin, Direct: 0.1 mg/dL (ref 0.0–0.3)
Indirect Bilirubin: 0.5 mg/dL (ref 0.2–1.2)
TOTAL PROTEIN: 7.1 g/dL (ref 6.0–8.3)
Total Bilirubin: 0.6 mg/dL (ref 0.2–1.2)

## 2013-11-03 LAB — BASIC METABOLIC PANEL WITH GFR
BUN: 16 mg/dL (ref 6–23)
CO2: 28 mEq/L (ref 19–32)
CREATININE: 0.8 mg/dL (ref 0.50–1.10)
Calcium: 10.2 mg/dL (ref 8.4–10.5)
Chloride: 99 mEq/L (ref 96–112)
GFR, EST AFRICAN AMERICAN: 84 mL/min
GFR, EST NON AFRICAN AMERICAN: 73 mL/min
Glucose, Bld: 101 mg/dL — ABNORMAL HIGH (ref 70–99)
Potassium: 4.4 mEq/L (ref 3.5–5.3)
Sodium: 136 mEq/L (ref 135–145)

## 2013-11-03 LAB — HEMOGLOBIN A1C
Hgb A1c MFr Bld: 5.9 % — ABNORMAL HIGH (ref <5.7)
Mean Plasma Glucose: 123 mg/dL — ABNORMAL HIGH (ref <117)

## 2013-11-03 LAB — CBC WITH DIFFERENTIAL/PLATELET
BASOS ABS: 0.1 10*3/uL (ref 0.0–0.1)
BASOS PCT: 1 % (ref 0–1)
EOS PCT: 7 % — AB (ref 0–5)
Eosinophils Absolute: 0.5 10*3/uL (ref 0.0–0.7)
HCT: 40.1 % (ref 36.0–46.0)
Hemoglobin: 13.6 g/dL (ref 12.0–15.0)
LYMPHS PCT: 42 % (ref 12–46)
Lymphs Abs: 3 10*3/uL (ref 0.7–4.0)
MCH: 30.6 pg (ref 26.0–34.0)
MCHC: 33.9 g/dL (ref 30.0–36.0)
MCV: 90.1 fL (ref 78.0–100.0)
MONO ABS: 0.6 10*3/uL (ref 0.1–1.0)
Monocytes Relative: 8 % (ref 3–12)
NEUTROS ABS: 3 10*3/uL (ref 1.7–7.7)
Neutrophils Relative %: 42 % — ABNORMAL LOW (ref 43–77)
PLATELETS: 303 10*3/uL (ref 150–400)
RBC: 4.45 MIL/uL (ref 3.87–5.11)
RDW: 15.2 % (ref 11.5–15.5)
WBC: 7.1 10*3/uL (ref 4.0–10.5)

## 2013-11-03 LAB — TSH: TSH: 1.7 u[IU]/mL (ref 0.350–4.500)

## 2013-11-03 LAB — LIPID PANEL
CHOL/HDL RATIO: 3.5 ratio
CHOLESTEROL: 163 mg/dL (ref 0–200)
HDL: 46 mg/dL (ref >39)
LDL Cholesterol: 87 mg/dL (ref 0–99)
TRIGLYCERIDES: 149 mg/dL (ref <150)
VLDL: 30 mg/dL (ref 0–40)

## 2013-11-03 LAB — URIC ACID: Uric Acid, Serum: 5.9 mg/dL (ref 2.4–7.0)

## 2013-11-03 LAB — MAGNESIUM: MAGNESIUM: 1.7 mg/dL (ref 1.5–2.5)

## 2013-11-03 MED ORDER — PREDNISONE 20 MG PO TABS: ORAL_TABLET | ORAL | Status: DC

## 2013-11-03 MED ORDER — LIDOCAINE HCL 2 % EX GEL: 1.0000 "application " | CUTANEOUS | Status: DC | PRN

## 2013-11-03 NOTE — Patient Instructions (Signed)
 Bad carbs also include fruit juice, alcohol, and sweet tea. These are empty calories that do not signal to your brain that you are full.   Please remember the good carbs are still carbs which convert into sugar. So please measure them out no more than 1/2-1 cup of rice, oatmeal, pasta, and beans.  Veggies are however free foods! Pile them on.   I like lean protein at every meal such as chicken, turkey, pork chops, cottage cheese, etc. Just do not fry these meats and please center your meal around vegetable, the meats should be a side dish.   No all fruit is created equal. Please see the list below, the fruit at the bottom is higher in sugars than the fruit at the top   Preventative Care for Adults - Female      MAINTAIN REGULAR HEALTH EXAMS:  A routine yearly physical is a good way to check in with your primary care provider about your health and preventive screening. It is also an opportunity to share updates about your health and any concerns you have, and receive a thorough all-over exam.   Most health insurance companies pay for at least some preventative services.  Check with your health plan for specific coverages.  WHAT PREVENTATIVE SERVICES DO WOMEN NEED?  Adult women should have their weight and blood pressure checked regularly.   Women age 35 and older should have their cholesterol levels checked regularly.  Women should be screened for cervical cancer with a Pap smear and pelvic exam beginning at either age 21, or 3 years after they become sexually activity.    Breast cancer screening generally begins at age 40 with a mammogram and breast exam by your primary care provider.    Beginning at age 50 and continuing to age 75, women should be screened for colorectal cancer.  Certain people may need continued testing until age 85.  Updating vaccinations is part of preventative care.  Vaccinations help protect against diseases such as the flu.  Osteoporosis is a disease in  which the bones lose minerals and strength as we age. Women ages 65 and over should discuss this with their caregivers, as should women after menopause who have other risk factors.  Lab tests are generally done as part of preventative care to screen for anemia and blood disorders, to screen for problems with the kidneys and liver, to screen for bladder problems, to check blood sugar, and to check your cholesterol level.  Preventative services generally include counseling about diet, exercise, avoiding tobacco, drugs, excessive alcohol consumption, and sexually transmitted infections.    GENERAL RECOMMENDATIONS FOR GOOD HEALTH:  Healthy diet:  Eat a variety of foods, including fruit, vegetables, animal or vegetable protein, such as meat, fish, chicken, and eggs, or beans, lentils, tofu, and grains, such as rice.  Drink plenty of water daily.  Decrease saturated fat in the diet, avoid lots of red meat, processed foods, sweets, fast foods, and fried foods.  Exercise:  Aerobic exercise helps maintain good heart health. At least 30-40 minutes of moderate-intensity exercise is recommended. For example, a brisk walk that increases your heart rate and breathing. This should be done on most days of the week.   Find a type of exercise or a variety of exercises that you enjoy so that it becomes a part of your daily life.  Examples are running, walking, swimming, water aerobics, and biking.  For motivation and support, explore group exercise such as aerobic class, spin class,   Zumba, Yoga,or  martial arts, etc.    Set exercise goals for yourself, such as a certain weight goal, walk or run in a race such as a 5k walk/run.  Speak to your primary care provider about exercise goals.  Disease prevention:  If you smoke or chew tobacco, find out from your caregiver how to quit. It can literally save your life, no matter how long you have been a tobacco user. If you do not use tobacco, never begin.   Maintain  a healthy diet and normal weight. Increased weight leads to problems with blood pressure and diabetes.   The Body Mass Index or BMI is a way of measuring how much of your body is fat. Having a BMI above 27 increases the risk of heart disease, diabetes, hypertension, stroke and other problems related to obesity. Your caregiver can help determine your BMI and based on it develop an exercise and dietary program to help you achieve or maintain this important measurement at a healthful level.  High blood pressure causes heart and blood vessel problems.  Persistent high blood pressure should be treated with medicine if weight loss and exercise do not work.   Fat and cholesterol leaves deposits in your arteries that can block them. This causes heart disease and vessel disease elsewhere in your body.  If your cholesterol is found to be high, or if you have heart disease or certain other medical conditions, then you may need to have your cholesterol monitored frequently and be treated with medication.   Ask if you should have a cardiac stress test if your history suggests this. A stress test is a test done on a treadmill that looks for heart disease. This test can find disease prior to there being a problem.  Menopause can be associated with physical symptoms and risks. Hormone replacement therapy is available to decrease these. You should talk to your caregiver about whether starting or continuing to take hormones is right for you.   Osteoporosis is a disease in which the bones lose minerals and strength as we age. This can result in serious bone fractures. Risk of osteoporosis can be identified using a bone density scan. Women ages 65 and over should discuss this with their caregivers, as should women after menopause who have other risk factors. Ask your caregiver whether you should be taking a calcium supplement and Vitamin D, to reduce the rate of osteoporosis.   Avoid drinking alcohol in excess (more than  two drinks per day).  Avoid use of street drugs. Do not share needles with anyone. Ask for professional help if you need assistance or instructions on stopping the use of alcohol, cigarettes, and/or drugs.  Brush your teeth twice a day with fluoride toothpaste, and floss once a day. Good oral hygiene prevents tooth decay and gum disease. The problems can be painful, unattractive, and can cause other health problems. Visit your dentist for a routine oral and dental check up and preventive care every 6-12 months.   Look at your skin regularly.  Use a mirror to look at your back. Notify your caregivers of changes in moles, especially if there are changes in shapes, colors, a size larger than a pencil eraser, an irregular border, or development of new moles.  Safety:  Use seatbelts 100% of the time, whether driving or as a passenger.  Use safety devices such as hearing protection if you work in environments with loud noise or significant background noise.  Use safety glasses when doing   any work that could send debris in to the eyes.  Use a helmet if you ride a bike or motorcycle.  Use appropriate safety gear for contact sports.  Talk to your caregiver about gun safety.  Use sunscreen with a SPF (or skin protection factor) of 15 or greater.  Lighter skinned people are at a greater risk of skin cancer. Don't forget to also wear sunglasses in order to protect your eyes from too much damaging sunlight. Damaging sunlight can accelerate cataract formation.   Practice safe sex. Use condoms. Condoms are used for birth control and to help reduce the spread of sexually transmitted infections (or STIs).  Some of the STIs are gonorrhea (the clap), chlamydia, syphilis, trichomonas, herpes, HPV (human papilloma virus) and HIV (human immunodeficiency virus) which causes AIDS. The herpes, HIV and HPV are viral illnesses that have no cure. These can result in disability, cancer and death.   Keep carbon monoxide and smoke  detectors in your home functioning at all times. Change the batteries every 6 months or use a model that plugs into the wall.   Vaccinations:  Stay up to date with your tetanus shots and other required immunizations. You should have a booster for tetanus every 10 years. Be sure to get your flu shot every year, since 5%-20% of the [place] population comes down with the flu. The flu vaccine changes each year, so being vaccinated once is not enough. Get your shot in the fall, before the flu season peaks.   Other vaccines to consider:  Human Papilloma Virus or HPV causes cancer of the cervix, and other infections that can be transmitted from person to person. There is a vaccine for HPV, and females should get immunized between the ages of 11 and 26. It requires a series of 3 shots.   Pneumococcal vaccine to protect against certain types of pneumonia.  This is normally recommended for adults age 65 or older.  However, adults younger than 74 years old with certain underlying conditions such as diabetes, heart or lung disease should also receive the vaccine.  Shingles vaccine to protect against Varicella Zoster if you are older than age 60, or younger than 74 years old with certain underlying illness.  Hepatitis A vaccine to protect against a form of infection of the liver by a virus acquired from food.  Hepatitis B vaccine to protect against a form of infection of the liver by a virus acquired from blood or body fluids, particularly if you work in health care.  If you plan to travel internationally, check with your local health department for specific vaccination recommendations.  Cancer Screening:  Breast cancer screening is essential to preventive care for women. All women age 20 and older should perform a breast self-exam every month. At age 40 and older, women should have their caregiver complete a breast exam each year. Women at ages 40 and older should have a mammogram (x-ray film) of the breasts.  Your caregiver can discuss how often you need mammograms.    Cervical cancer screening includes taking a Pap smear (sample of cells examined under a microscope) from the cervix (end of the uterus). It also includes testing for HPV (Human Papilloma Virus, which can cause cervical cancer). Screening and a pelvic exam should begin at age 21, or 3 years after a woman becomes sexually active. Screening should occur every year, with a Pap smear but no HPV testing, up to age 30. After age 30, you should have a Pap smear   every 3 years with HPV testing, if no HPV was found previously.   Most routine colon cancer screening begins at the age of 50. On a yearly basis, doctors may provide special easy to use take-home tests to check for hidden blood in the stool. Sigmoidoscopy or colonoscopy can detect the earliest forms of colon cancer and is life saving. These tests use a small camera at the end of a tube to directly examine the colon. Speak to your caregiver about this at age 50, when routine screening begins (and is repeated every 5 years unless early forms of pre-cancerous polyps or small growths are found).   

## 2013-11-03 NOTE — Progress Notes (Signed)
MEDICARE ANNUAL WELLNESS VISIT AND FOLLOW UP  Assessment:   1. Hypertension - CBC with Differential - BASIC METABOLIC PANEL WITH GFR - Hepatic function panel  2. 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 - Hemoglobin A1c - [facility] DIABETES FOOT EXAM  3. Hypothyroidism - TSH  4. Rheumatoid arthritis Continue DMARD, will send results to Dr. Dareen Piano  5. Hyperlipidemia - Lipid panel  6. Vitamin D deficiency - Vit D  25 hydroxy (rtn osteoporosis monitoring)  7. Encounter for long-term (current) use of other medications - Magnesium  8. Gout Prednisone taper sent, if uric acid elevated will send in allopurinol.  - Uric acid   Plan:   During the course of the visit the patient was educated and counseled about appropriate screening and preventive services including:    Pneumococcal vaccine   Influenza vaccine  Td vaccine  Screening electrocardiogram  Screening mammography  Bone densitometry screening  Colorectal cancer screening  Diabetes screening  Glaucoma screening  Nutrition counseling   Advanced directives: given info/requested  Screening recommendations, referrals:  Vaccinations: Tdap vaccine up to date Influenza vaccine up to date Pneumococcal vaccine allergy Shingles vaccine up to date Hep B vaccine not indicated  Nutrition assessed and recommended  Colonoscopy due 2020 Mammogram due in Oct Pap smear not indicated Pelvic exam not indicated Recommended yearly ophthalmology/optometry visit for glaucoma screening and checkup Recommended yearly dental visit for hygiene and checkup Advanced directives - requested  Conditions/risks identified: BMI: Discussed weight loss, diet, and increase physical activity.  Increase physical  activity: AHA recommends 150 minutes of physical activity a week.  Medications reviewed DEXA- requested Diabetes is at goal, ACE/ARB therapy: Yes. RA- on DMARD Urinary Incontinence is not an issue: discussed non pharmacology and pharmacology options.  Fall risk: low- discussed PT, home fall assessment, medications.    Subjective:   Angelica Parker is a 73 y.o. female who presents for Medicare Annual Wellness Visit and 3 month follow up on hypertension, prediabetes hyperlipidemia, vitamin D def.  Date of last medicare wellness visit is unknown.   Her blood pressure has been controlled at home, today their BP is BP: 122/62 mmHg She does not workout but stays active in her yard. She denies chest pain, shortness of breath, dizziness.  She is not on cholesterol medication and denies myalgias. Her cholesterol is at goal. The cholesterol last visit was:   Lab Results  Component Value Date   CHOL 169 07/31/2013   HDL 53 07/31/2013   LDLCALC 95 07/31/2013   TRIG 105 07/31/2013   CHOLHDL 3.2 07/31/2013   She has been working on diet and exercise for prediabetes, and denies paresthesia of the feet, polydipsia and polyuria. Last A1C in the office was:  Lab Results  Component Value Date   HGBA1C 6.1* 07/31/2013   Patient is on Vitamin D supplement. She states that she has had gout before and that on Thursday she started to have pain, redness, and swelling in her left 1st MTP.  She is on thyroid medication. Her medication was not changed last visit. Patient denies nervousness, palpitations and weight changes.  Lab Results  Component Value Date   TSH 0.802 07/31/2013  .   Names of Other Physician/Practitioners you currently use: 1. [facility]- here for primary care 2. Dr. August Luz, eye doctor, last  visit less than a year 3. No dentist, has dentures Patient Care Team: Lucky Cowboy, MD as PCP - General (Internal Medicine) Sherrian Divers, MD as Consulting  Physician (Rheumatology) Robley Fries, MD as Consulting Physician (Obstetrics and Gynecology) Lewayne Bunting, MD as Consulting Physician (Cardiology) Florencia Reasons, MD as Consulting Physician (Gastroenterology)  Medication Review Current Outpatient Prescriptions on File Prior to Visit  Medication Sig Dispense Refill  . adalimumab (HUMIRA) 40 MG/0.8ML injection Inject 40 mg into the skin every 14 (fourteen) days.      Marland Kitchen ADVAIR DISKUS 100-50 MCG/DOSE AEPB       . aspirin 81 MG chewable tablet Chew by mouth daily.      . bisoprolol-hydrochlorothiazide (ZIAC) 5-6.25 MG per tablet Take 1 tablet by mouth daily.      . calcium carbonate (OS-CAL) 600 MG TABS tablet Take 600 mg by mouth 2 (two) times daily with a meal.      . Cholecalciferol (VITAMIN D PO) Take 4,000 Int'l Units by mouth daily.      . citalopram (CELEXA) 40 MG tablet take 1 tablet by mouth once daily FOR MOOD  90 tablet  1  . clotrimazole (GYNE-LOTRIMIN) 1 % vaginal cream Place 1 Applicatorful vaginally at bedtime.  45 g  4  . folic acid (FOLVITE) 1 MG tablet Take by mouth daily.      . hydrochlorothiazide (HYDRODIURIL) 25 MG tablet       . levothyroxine (SYNTHROID, LEVOTHROID) 112 MCG tablet Take 112 mcg by mouth daily before breakfast.      . losartan (COZAAR) 100 MG tablet take 1 tablet by mouth once daily  90 tablet  2  . Magnesium 400 MG CAPS Take 400 mg by mouth 2 (two) times daily.      . methotrexate (RHEUMATREX) 2.5 MG tablet       . Multiple Vitamins-Minerals (MULTIVITAMIN PO) Take by mouth daily.      . Omega-3 Fatty Acids (FISH OIL PO) Take by mouth 2 (two) times daily.        No current facility-administered medications on file prior to visit.    Current Problems (verified) Patient Active Problem List   Diagnosis Date Noted  . Encounter for long-term (current) use of other medications 07/31/2013  . 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 Hyperlipidemia   . Hypertension   . Hypothyroidism   . Vitamin D  deficiency   . Allergy   . Rheumatoid arthritis     Screening Tests Health Maintenance  Topic Date Due  . Pneumococcal Polysaccharide Vaccine Age 1 And Over  06/03/2004  . Influenza Vaccine  12/27/2013  . Mammogram  03/06/2015  . Tetanus/tdap  05/30/2015  . Colonoscopy  05/29/2018  . Zostavax  Completed     Immunization History  Administered Date(s) Administered  . Pneumococcal Polysaccharide-23 05/30/2003  . Td 05/29/2005  . Zoster 05/29/2006    Preventative care: Tetanus: 2007  Pneumovax: Allergy  Flu vaccine: 02/04/2013  Zostavax:2008  Pap: 2012 neg  MGM: 03/05/2013 neg due 02/2014 DEXA: 2013 nl  Due this year Colonoscopy: 2010 + tics due 2020  EGD: N/A  History reviewed: allergies, current medications, past family history, past medical history, past social history, past surgical history and problem list  Risk Factors: Osteoporosis: postmenopausal estrogen deficiency and dietary calcium and/or vitamin D deficiency History of fracture in the past year: no  Tobacco History  Substance Use Topics  . Smoking status: Former Games developer  .  Smokeless tobacco: Never Used  . Alcohol Use: Not on file   She does not smoke.  Patient is a former smoker. Are there smokers in your home (other than you)?  No  Alcohol Current alcohol use: none  Caffeine Current caffeine use: coffee 1 /day  Exercise Current exercise: none  Nutrition/Diet Current diet: in general, a "healthy" diet    Cardiac risk factors: advanced age (older than 49 for men, 85 for women), diabetes mellitus, dyslipidemia, hypertension, obesity (BMI >= 30 kg/m2) and sedentary lifestyle.  Depression Screen (Note: if answer to either of the following is "Yes", a more complete depression screening is indicated)   Q1: Over the past two weeks, have you felt down, depressed or hopeless? No  Q2: Over the past two weeks, have you felt little interest or pleasure in doing things? No  Have you lost interest or  pleasure in daily life? No  Do you often feel hopeless? No  Do you cry easily over simple problems? No  Activities of Daily Living In your present state of health, do you have any difficulty performing the following activities?:  Driving? No Managing money?  No Feeding yourself? No Getting from bed to chair? No Climbing a flight of stairs? No Preparing food and eating?: No Bathing or showering? No Getting dressed: No Getting to the toilet? No Using the toilet:No Moving around from place to place: No In the past year have you fallen or had a near fall?:No   Are you sexually active?  No  Do you have more than one partner?  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 Jonny Ruiz May Do you often ask people to speak up or repeat themselves? Yes Do you experience ringing or noises in your ears? No Do you have difficulty understanding soft or whispered voices? Yes  Cognition  Do you feel that you have a problem with memory?No  Do you often misplace items? No  Do you feel safe at home?  Yes  Advanced directives Does patient have a Health Care Power of Attorney? Yes Does patient have a Living Will? Yes   Objective:   Blood pressure 122/62, pulse 76, temperature 97.9 F (36.6 C), resp. rate 16, height 5' 6.5" (1.689 m), weight 199 lb (90.266 kg). Body mass index is 31.64 kg/(m^2).  General appearance: alert, no distress, WD/WN,  female Cognitive Testing  Alert? Yes  Normal Appearance?Yes  Oriented to person? Yes  Place? Yes   Time? Yes  Recall of three objects?  Yes  Can perform simple calculations? Yes  Displays appropriate judgment?Yes  Can read the correct time from a watch face?No  HEENT: normocephalic, sclerae anicteric, right TM with scarring, left TM with scars and inferior perforation, nares patent, no discharge or erythema, pharynx normal Oral cavity: MMM, no lesions Neck: supple, no lymphadenopathy, no  thyromegaly, no masses Heart: RRR, normal S1, S2, no murmurs Lungs: CTA bilaterally, no wheezes, rhonchi, or rales Abdomen: +bs, soft, non tender, non distended, no masses, no hepatomegaly, no splenomegaly Musculoskeletal: nontender, no swelling, no obvious deformity Extremities: no edema, no cyanosis, no clubbing, left 1st distal MTP with erythema, swelling.  Pulses: 2+ symmetric, upper and lower extremities, normal cap refill Neurological: alert, oriented x 3, CN2-12 intact, strength normal upper extremities and lower extremities, sensation normal throughout, DTRs 2+ throughout, no cerebellar signs, gait normal Psychiatric: normal affect, behavior normal, pleasant  Breast: defer Gyn: defer Rectal: defer  Medicare Attestation  I have personally reviewed: The patient's medical and social history Their use of alcohol, tobacco or illicit drugs Their current medications and supplements The patient's functional ability including ADLs,fall risks, home safety risks, cognitive, and hearing and visual impairment Diet and physical activities Evidence for depression or mood disorders  The patient's weight, height, BMI, and visual acuity have been recorded in the chart.  I have made referrals, counseling, and provided education to the patient based on review of the above and I have provided the patient with a written personalized care plan for preventive services.     Quentin Mulling, PA-C   11/03/2013

## 2013-11-04 LAB — VITAMIN D 25 HYDROXY (VIT D DEFICIENCY, FRACTURES): Vit D, 25-Hydroxy: 74 ng/mL (ref 30–89)

## 2013-12-02 ENCOUNTER — Ambulatory Visit (INDEPENDENT_AMBULATORY_CARE_PROVIDER_SITE_OTHER): Payer: Medicare Other

## 2013-12-02 DIAGNOSIS — R6889 Other general symptoms and signs: Secondary | ICD-10-CM

## 2013-12-02 LAB — URIC ACID: Uric Acid, Serum: 6.3 mg/dL (ref 2.4–7.0)

## 2013-12-02 NOTE — Progress Notes (Signed)
Patient ID: Angelica Parker, female   DOB: 1941-03-24, 73 y.o.   MRN: [id number] Patient here today to recheck uric acid.

## 2014-01-13 ENCOUNTER — Other Ambulatory Visit: Payer: Self-pay

## 2014-01-13 MED ORDER — CITALOPRAM HYDROBROMIDE 40 MG PO TABS: 40.0000 mg | ORAL_TABLET | Freq: Every day | ORAL | Status: DC

## 2014-01-31 NOTE — Progress Notes (Signed)
Patient ID: Angelica Parker, female   DOB: 23-Mar-1941, 73 y.o.   MRN: [id number]   This very nice 73 y.o.female presents for 3 month follow up with Hypertension, Hyperlipidemia, Rheumatoid Arthritis,  Pre-Diabetes and Vitamin D Deficiency. Rheumatoid Arthritis is followed by Dr Azzie Roup and patient denies any Sx's of arthralgias on current therapy.   Patient is treated for HTN & BP has been controlled at home. Today's BP: 116/64 mmHg. Patient denies any cardiac type chest pain, palpitations, dyspnea/orthopnea/PND, dizziness, claudication, or dependent edema.   Hyperlipidemia is controlled  At goal with diet. Patient denies myalgias or other med SE's. Last Lipids were at goal as follows: Chol 163; HDL  46; LDL  87; Trig 149 on  11/03/2013.   Also, the patient has history of T2_NIDDM with A1c 6.1% in Nov 2013 and managed with diet and patient denies any symptoms of reactive hypoglycemia, diabetic polys, paresthesias or visual blurring.  Last A1c was  5.9% on  11/03/2013.    Further, Patient has history of Vitamin D Deficiency and patient supplements vitamin D without any suspected side-effects. Last vitamin D was  74 on 11/03/2013   Medication List   ADVAIR DISKUS 100-50 MCG/DOSE Aepb  Generic drug:  Fluticasone-Salmeterol     aspirin 81 MG chewable tablet  Chew by mouth daily.     calcium carbonate 600 MG Tabs tablet  Commonly known as:  OS-CAL  Take 600 mg by mouth 2 (two) times daily with a meal.     citalopram 40 MG tablet  Commonly known as:  CELEXA  Take 1 tablet (40 mg total) by mouth daily.     FISH OIL PO  Take by mouth 2 (two) times daily.     folic acid 1 MG tablet  Commonly known as:  FOLVITE  Take by mouth daily.     HUMIRA 40 MG/0.8ML injection  Generic drug:  adalimumab  Inject 40 mg into the skin every 14 (fourteen) days.     hydrochlorothiazide 25 MG tablet  Commonly known as:  HYDRODIURIL     levothyroxine 112 MCG tablet  Commonly known as:  SYNTHROID, LEVOTHROID   Take 112 mcg by mouth daily before breakfast.     losartan 100 MG tablet  Commonly known as:  COZAAR  take 1 tablet by mouth once daily     Magnesium 400 MG Caps  Take 400 mg by mouth 2 (two) times daily.     methotrexate 2.5 MG tablet  Commonly known as:  RHEUMATREX     MULTIVITAMIN PO  Take by mouth daily.     VITAMIN D PO  Take 4,000 Int'l Units by mouth daily.     Allergies  Allergen Reactions  . Ace Inhibitors Cough  . Pneumococcal Vaccines    PMHx:   Past Medical History  Diagnosis Date  . Hyperlipidemia   . Hypertension   . Prediabetes   . Thyroid disease   . Vitamin D deficiency   . Allergy   . Rheumatoid arthritis    FHx:    Reviewed / unchanged SHx:    Reviewed / unchanged  Systems Review:  Constitutional: Denies fever, chills, wt changes, headaches, insomnia, fatigue, night sweats, change in appetite. Eyes: Denies redness, blurred vision, diplopia, discharge, itchy, watery eyes.  ENT: Denies discharge, congestion, post nasal drip, epistaxis, sore throat, earache, hearing loss, dental pain, tinnitus, vertigo, sinus pain, snoring.  CV: Denies chest pain, palpitations, irregular heartbeat, syncope, dyspnea, diaphoresis, orthopnea, PND, claudication or edema.  Respiratory: denies cough, dyspnea, DOE, pleurisy, hoarseness, laryngitis, wheezing.  Gastrointestinal: Denies dysphagia, odynophagia, heartburn, reflux, water brash, abdominal pain or cramps, nausea, vomiting, bloating, diarrhea, constipation, hematemesis, melena, hematochezia  or hemorrhoids. Genitourinary: Denies dysuria, frequency, urgency, nocturia, hesitancy, discharge, hematuria or flank pain. Musculoskeletal: Denies arthralgias, myalgias, stiffness, jt. swelling, pain, limping or strain/sprain.  Skin: Denies pruritus, rash, hives, warts, acne, eczema or change in skin lesion(s). Neuro: No weakness, tremor, incoordination, spasms, paresthesia or pain. Psychiatric: Denies confusion, memory loss or  sensory loss. Endo: Denies change in weight, skin or hair change.  Heme/Lymph: No excessive bleeding, bruising or enlarged lymph nodes.  Exam:  BP 116/64  Pulse 80  Temp 98.2 F   Resp 16  Ht 5' 6.5"   Wt 199 lb 12.8 oz   BMI 31.77 kg/m2  Appears well nourished and in no distress. Eyes: PERRLA, EOMs, conjunctiva no swelling or erythema. Sinuses: No frontal/maxillary tenderness ENT/Mouth: EAC's clear, TM's nl w/o erythema, bulging. Nares clear w/o erythema, swelling, exudates. Oropharynx clear without erythema or exudates. Oral hygiene is good. Tongue normal, non obstructing. Hearing intact.  Neck: Supple. Thyroid nl. Car 2+/2+ without bruits, nodes or JVD. Chest: Respirations nl with BS clear & equal w/o rales, rhonchi, wheezing or stridor.  Cor: Heart sounds normal w/ regular rate and rhythm without sig. murmurs, gallops, clicks, or rubs. Peripheral pulses normal and equal  without edema.  Abdomen: Soft & bowel sounds normal. Non-tender w/o guarding, rebound, hernias, masses, or organomegaly.  Lymphatics: Unremarkable.  Musculoskeletal: Full ROM all peripheral extremities, joint stability, 5/5 strength, and normal gait.  Skin: Warm, dry without exposed rashes, lesions or ecchymosis apparent.  Neuro: Cranial nerves intact, reflexes equal bilaterally. Sensory-motor testing grossly intact. Tendon reflexes grossly intact.  Pysch: Alert & oriented x 3.  Insight and judgement nl & appropriate. No ideations.  Assessment and Plan:  1. Hypertension - Continue monitor blood pressure at home. Continue diet/meds same.  2. Hyperlipidemia - Continue diet/meds, exercise,& lifestyle modifications. Continue monitor periodic cholesterol/liver & renal functions   3. T2_NIDDM- diet controlled - Continue diet, exercise, lifestyle modifications. Monitor appropriate labs.  4. Vitamin D Deficiency - Continue supplementation.  Recommended regular exercise, BP monitoring, weight control, and discussed  med and SE's. Recommended labs to assess and monitor clinical status. Further disposition pending results of labs.

## 2014-01-31 NOTE — Patient Instructions (Signed)

## 2014-02-03 ENCOUNTER — Ambulatory Visit (INDEPENDENT_AMBULATORY_CARE_PROVIDER_SITE_OTHER): Payer: Medicare Other | Admitting: Internal Medicine

## 2014-02-03 ENCOUNTER — Encounter: Payer: Self-pay | Admitting: Internal Medicine

## 2014-02-03 VITALS — BP 116/64 | HR 80 | Temp 98.2°F | Resp 16 | Ht 66.5 in | Wt 199.8 lb

## 2014-02-03 DIAGNOSIS — E785 Hyperlipidemia, unspecified: Secondary | ICD-10-CM

## 2014-02-03 DIAGNOSIS — Z79899 Other long term (current) drug therapy: Secondary | ICD-10-CM

## 2014-02-03 DIAGNOSIS — Z23 Encounter for immunization: Secondary | ICD-10-CM

## 2014-02-03 DIAGNOSIS — I10 Essential (primary) hypertension: Secondary | ICD-10-CM

## 2014-02-03 DIAGNOSIS — E559 Vitamin D deficiency, unspecified: Secondary | ICD-10-CM

## 2014-02-03 DIAGNOSIS — R7309 Other abnormal glucose: Secondary | ICD-10-CM

## 2014-02-03 LAB — CBC WITH DIFFERENTIAL/PLATELET
Basophils Absolute: 0.1 10*3/uL (ref 0.0–0.1)
Basophils Relative: 1 % (ref 0–1)
EOS PCT: 9 % — AB (ref 0–5)
Eosinophils Absolute: 0.7 10*3/uL (ref 0.0–0.7)
HCT: 40.4 % (ref 36.0–46.0)
Hemoglobin: 13.8 g/dL (ref 12.0–15.0)
LYMPHS ABS: 3.3 10*3/uL (ref 0.7–4.0)
LYMPHS PCT: 41 % (ref 12–46)
MCH: 31.2 pg (ref 26.0–34.0)
MCHC: 34.2 g/dL (ref 30.0–36.0)
MCV: 91.2 fL (ref 78.0–100.0)
Monocytes Absolute: 0.5 10*3/uL (ref 0.1–1.0)
Monocytes Relative: 6 % (ref 3–12)
NEUTROS PCT: 43 % (ref 43–77)
Neutro Abs: 3.4 10*3/uL (ref 1.7–7.7)
PLATELETS: 303 10*3/uL (ref 150–400)
RBC: 4.43 MIL/uL (ref 3.87–5.11)
RDW: 14.9 % (ref 11.5–15.5)
WBC: 8 10*3/uL (ref 4.0–10.5)

## 2014-02-03 LAB — HEMOGLOBIN A1C
Hgb A1c MFr Bld: 6.1 % — ABNORMAL HIGH (ref <5.7)
Mean Plasma Glucose: 128 mg/dL — ABNORMAL HIGH (ref <117)

## 2014-02-03 MED ORDER — ADALIMUMAB 40 MG/0.8ML ~~LOC~~ PSKT: 40.0000 mg | PREFILLED_SYRINGE | SUBCUTANEOUS | Status: DC

## 2014-02-04 LAB — BASIC METABOLIC PANEL WITH GFR
BUN: 17 mg/dL (ref 6–23)
CHLORIDE: 99 meq/L (ref 96–112)
CO2: 28 meq/L (ref 19–32)
Calcium: 10 mg/dL (ref 8.4–10.5)
Creat: 0.82 mg/dL (ref 0.50–1.10)
GFR, Est African American: 82 mL/min
GFR, Est Non African American: 71 mL/min
Glucose, Bld: 96 mg/dL (ref 70–99)
Potassium: 4 mEq/L (ref 3.5–5.3)
SODIUM: 135 meq/L (ref 135–145)

## 2014-02-04 LAB — HEPATIC FUNCTION PANEL
ALK PHOS: 87 U/L (ref 39–117)
ALT: 23 U/L (ref 0–35)
AST: 18 U/L (ref 0–37)
Albumin: 4.1 g/dL (ref 3.5–5.2)
BILIRUBIN DIRECT: 0.1 mg/dL (ref 0.0–0.3)
BILIRUBIN TOTAL: 0.5 mg/dL (ref 0.2–1.2)
Indirect Bilirubin: 0.4 mg/dL (ref 0.2–1.2)
Total Protein: 6.6 g/dL (ref 6.0–8.3)

## 2014-02-04 LAB — LIPID PANEL
CHOL/HDL RATIO: 3.4 ratio
Cholesterol: 158 mg/dL (ref 0–200)
HDL: 46 mg/dL (ref >39)
LDL Cholesterol: 86 mg/dL (ref 0–99)
Triglycerides: 130 mg/dL (ref <150)
VLDL: 26 mg/dL (ref 0–40)

## 2014-02-04 LAB — VITAMIN D 25 HYDROXY (VIT D DEFICIENCY, FRACTURES): VIT D 25 HYDROXY: 78 ng/mL (ref 30–89)

## 2014-02-04 LAB — INSULIN, FASTING: Insulin fasting, serum: 31.5 u[IU]/mL — ABNORMAL HIGH (ref 2.0–19.6)

## 2014-02-04 LAB — MAGNESIUM: Magnesium: 1.7 mg/dL (ref 1.5–2.5)

## 2014-02-04 LAB — TSH: TSH: 0.168 u[IU]/mL — ABNORMAL LOW (ref 0.350–4.500)

## 2014-02-11 ENCOUNTER — Other Ambulatory Visit: Payer: Self-pay

## 2014-02-11 MED ORDER — GLUCOSE BLOOD VI STRP: ORAL_STRIP | Status: DC

## 2014-03-01 ENCOUNTER — Other Ambulatory Visit: Payer: Self-pay | Admitting: Internal Medicine

## 2014-04-13 ENCOUNTER — Other Ambulatory Visit: Payer: Self-pay | Admitting: Physician Assistant

## 2014-04-13 ENCOUNTER — Other Ambulatory Visit: Payer: Self-pay

## 2014-04-13 MED ORDER — LEVOTHYROXINE SODIUM 112 MCG PO TABS: 112.0000 ug | ORAL_TABLET | Freq: Every day | ORAL | Status: DC

## 2014-04-19 ENCOUNTER — Encounter: Payer: Self-pay | Admitting: *Deleted

## 2014-04-30 ENCOUNTER — Encounter: Payer: Self-pay | Admitting: Physician Assistant

## 2014-05-11 ENCOUNTER — Other Ambulatory Visit: Payer: Self-pay | Admitting: *Deleted

## 2014-05-11 MED ORDER — FLUTICASONE-SALMETEROL 100-50 MCG/DOSE IN AEPB: INHALATION_SPRAY | RESPIRATORY_TRACT | Status: DC

## 2014-06-02 ENCOUNTER — Ambulatory Visit (INDEPENDENT_AMBULATORY_CARE_PROVIDER_SITE_OTHER): Payer: Medicare Other | Admitting: Physician Assistant

## 2014-06-02 ENCOUNTER — Encounter: Payer: Self-pay | Admitting: Physician Assistant

## 2014-06-02 ENCOUNTER — Ambulatory Visit (HOSPITAL_COMMUNITY)
Admission: RE | Admit: 2014-06-02 | Discharge: 2014-06-02 | Disposition: A | Discharge status: Home / Self Care | Payer: 59 | Source: Ambulatory Visit | Attending: Physician Assistant | Admitting: Physician Assistant

## 2014-06-02 VITALS — BP 128/80 | HR 60 | Temp 97.7°F | Resp 16 | Ht 66.5 in | Wt 198.0 lb

## 2014-06-02 DIAGNOSIS — R05 Cough: Secondary | ICD-10-CM

## 2014-06-02 DIAGNOSIS — R059 Cough, unspecified: Secondary | ICD-10-CM

## 2014-06-02 DIAGNOSIS — E039 Hypothyroidism, unspecified: Secondary | ICD-10-CM

## 2014-06-02 DIAGNOSIS — T7840XS Allergy, unspecified, sequela: Secondary | ICD-10-CM

## 2014-06-02 DIAGNOSIS — Z79899 Other long term (current) drug therapy: Secondary | ICD-10-CM

## 2014-06-02 DIAGNOSIS — I10 Essential (primary) hypertension: Secondary | ICD-10-CM | POA: Insufficient documentation

## 2014-06-02 DIAGNOSIS — E785 Hyperlipidemia, unspecified: Secondary | ICD-10-CM

## 2014-06-02 DIAGNOSIS — N182 Chronic kidney disease, stage 2 (mild): Secondary | ICD-10-CM | POA: Insufficient documentation

## 2014-06-02 DIAGNOSIS — R7309 Other abnormal glucose: Secondary | ICD-10-CM

## 2014-06-02 DIAGNOSIS — J45909 Unspecified asthma, uncomplicated: Secondary | ICD-10-CM | POA: Diagnosis not present

## 2014-06-02 DIAGNOSIS — R7303 Prediabetes: Secondary | ICD-10-CM

## 2014-06-02 DIAGNOSIS — M069 Rheumatoid arthritis, unspecified: Secondary | ICD-10-CM

## 2014-06-02 DIAGNOSIS — E559 Vitamin D deficiency, unspecified: Secondary | ICD-10-CM

## 2014-06-02 LAB — CBC WITH DIFFERENTIAL/PLATELET
Basophils Absolute: 0.1 10*3/uL (ref 0.0–0.1)
Basophils Relative: 1 % (ref 0–1)
EOS PCT: 10 % — AB (ref 0–5)
Eosinophils Absolute: 0.7 10*3/uL (ref 0.0–0.7)
HEMATOCRIT: 42.3 % (ref 36.0–46.0)
Hemoglobin: 14 g/dL (ref 12.0–15.0)
Lymphocytes Relative: 44 % (ref 12–46)
Lymphs Abs: 3.2 10*3/uL (ref 0.7–4.0)
MCH: 30.7 pg (ref 26.0–34.0)
MCHC: 33.1 g/dL (ref 30.0–36.0)
MCV: 92.8 fL (ref 78.0–100.0)
MONO ABS: 0.4 10*3/uL (ref 0.1–1.0)
MONOS PCT: 6 % (ref 3–12)
MPV: 10.5 fL (ref 8.6–12.4)
Neutro Abs: 2.8 10*3/uL (ref 1.7–7.7)
Neutrophils Relative %: 39 % — ABNORMAL LOW (ref 43–77)
Platelets: 288 10*3/uL (ref 150–400)
RBC: 4.56 MIL/uL (ref 3.87–5.11)
RDW: 15 % (ref 11.5–15.5)
WBC: 7.2 10*3/uL (ref 4.0–10.5)

## 2014-06-02 MED ORDER — MONTELUKAST SODIUM 10 MG PO TABS
10.0000 mg | ORAL_TABLET | Freq: Every day | ORAL | Status: DC
Start: 2014-06-02 — End: 2014-09-09

## 2014-06-02 NOTE — Progress Notes (Signed)
Complete Physical  Assessment and Plan: 1. Essential hypertension - continue medications, DASH diet, exercise and monitor at home. Call if greater than 130/80.  - CBC with Differential - Hepatic function panel - Urinalysis, Routine w reflex microscopic - Microalbumin / creatinine urine ratio - EKG 12-Lead - [place], RETROPERITNL ABD,  LTD  2. Hypothyroidism, unspecified hypothyroidism type Hypothyroidism-check TSH level, continue medications the same, reminded to take on an empty stomach 30-62mins before food.  - TSH  3. Hyperlipidemia -continue medications, check lipids, decrease fatty foods, increase activity.  - Lipid panel  4. Prediabetes Discussed general issues about diabetes pathophysiology and management., Educational material distributed., Suggested low cholesterol diet., Encouraged aerobic exercise., Discussed foot care., Reminded to get yearly retinal exam. - Hemoglobin A1c - Insulin, fasting - HM DIABETES FOOT EXAM  5. CKD (chronic kidney disease) stage 2, GFR 60-89 ml/min Increase fluids, avoid NSAIDS, monitor sugars, will monitor - BASIC METABOLIC PANEL WITH GFR  6. Rheumatoid arthritis Send LFTs Dr. Dareen Piano, if cough/SOB continues may need a PFT  7. Vitamin D deficiency - Vit D  25 hydroxy (rtn osteoporosis monitoring)  8. Medication management - Magnesium  9. Allergy, sequela - montelukast (SINGULAIR) 10 MG tablet; Take 1 tablet (10 mg total) by mouth daily.  Dispense: 30 tablet; Refill: 2  10. Cough CXR,  singulair, do advair BID, albuterol PRN, no signs of CHF and weight stable.  - DG Chest 2 View; Future - montelukast (SINGULAIR) 10 MG tablet; Take 1 tablet (10 mg total) by mouth daily.  Dispense: 30 tablet; Refill: 2  Discussed med's effects and SE's. Screening labs and tests as requested with regular follow-up as recommended.  HPI  74 y.o. female  presents for a complete physical.   Her blood pressure has been controlled at home, today their BP is  BP: 128/80 mmHg She does not workout but does yard work She denies chest pain, shortness of breath, dizziness.  She is not on cholesterol medication and denies myalgias. Her cholesterol is at goal. The cholesterol last visit was:   Lab Results  Component Value Date   CHOL 158 02/03/2014   HDL 46 02/03/2014   LDLCALC 86 02/03/2014   TRIG 130 02/03/2014   CHOLHDL 3.4 02/03/2014   She has been working on diet and exercise for prediabetes, and denies paresthesia of the feet, polydipsia, polyuria and visual disturbances. Last A1C in the office was:  Lab Results  Component Value Date   HGBA1C 6.1* 02/03/2014  Patient is on Vitamin D supplement.   Lab Results  Component Value Date   VD25OH 78 02/03/2014   She has RA, follows with Dr. Dareen Piano.  She is on thyroid medication. Her medication was changed last visit, it was decreased to one pill daily.  Lab Results  Component Value Date   TSH 0.168* 02/03/2014  She was a former smoker, has COPD. She is on Advair and states that she has been having worsening wheezing and coughing, she states that it has been worse for last 2-3 months. She has not used her albuterol inhaler and only uses the Advair as needed. She denies orthopnea, PND, edema. She is on methotrexate, 8 on Friday. Last CXR was 2009, has never had PFTS.  Wt Readings from Last 3 Encounters:  06/02/14 198 lb (89.812 kg)  02/03/14 199 lb 12.8 oz (90.629 kg)  11/03/13 199 lb (90.266 kg)    Current Medications:  Current Outpatient Prescriptions on File Prior to Visit  Medication Sig Dispense Refill  .  adalimumab (HUMIRA) 40 MG/0.8ML injection Inject 40 mg into the skin every 14 (fourteen) days.    Marland Kitchen aspirin 81 MG chewable tablet Chew by mouth daily.    . calcium carbonate (OS-CAL) 600 MG TABS tablet Take 600 mg by mouth 2 (two) times daily with a meal.    . Cholecalciferol (VITAMIN D PO) Take 4,000 Int'l Units by mouth daily.    . citalopram (CELEXA) 40 MG tablet Take 1 tablet (40 mg  total) by mouth daily. 90 tablet 1  . Fluticasone-Salmeterol (ADVAIR DISKUS) 100-50 MCG/DOSE AEPB Inhale 1 dose by mouth twice a day. 60 each 1  . folic acid (FOLVITE) 1 MG tablet Take by mouth daily.    Marland Kitchen glucose blood (FREESTYLE LITE) test strip Test Blood Sugar one time daily 100 each prn  . hydrochlorothiazide (HYDRODIURIL) 25 MG tablet take 1 tablet by mouth once daily FOR BLOOD PRESSURE AND FLUID 90 tablet 99  . levothyroxine (SYNTHROID, LEVOTHROID) 112 MCG tablet Take 1 tablet (112 mcg total) by mouth daily before breakfast. 90 tablet 3  . losartan (COZAAR) 100 MG tablet take 1 tablet by mouth once daily 90 tablet 2  . Magnesium 400 MG CAPS Take 400 mg by mouth 2 (two) times daily.    . methotrexate (RHEUMATREX) 2.5 MG tablet Take 2.5 mg by mouth. Patient takes 8 pills (2.5 mg x 8 = 20.0 mg) every Friday    . Multiple Vitamins-Minerals (MULTIVITAMIN PO) Take by mouth daily.    . Omega-3 Fatty Acids (FISH OIL PO) Take by mouth 2 (two) times daily.      No current facility-administered medications on file prior to visit.   Health Maintenance:   Immunization History  Administered Date(s) Administered  . Influenza, High Dose Seasonal PF 02/03/2014  . Pneumococcal Polysaccharide-23 05/30/2003  . Td 05/29/2005  . Zoster 05/29/2006   Tetanus: 2007 due next year Pneumovax: 2005- had redness/swelling at this site, will need benadryl before prevnar  Prevnar: DUE Flu vaccine: 02/03/2014 Zostavax:2008  Pap: 2012 neg  MGM: 03/10/2014 neg due 02/2015 DEXA: 2013 nl Declines Colonoscopy: 2010 + tics due 2020  EGD: N/A Echo 01/2010 45% to 50% with mild MR Last Dental Exam: Dr. Weston Anna Last Eye Exam: Dr. August Luz  Patient Care Team: Lucky Cowboy, MD as PCP - General (Internal Medicine) Sherrian Divers, MD as Consulting Physician (Rheumatology) Robley Fries, MD as Consulting Physician (Obstetrics and Gynecology) Lewayne Bunting, MD as Consulting Physician  (Cardiology) Florencia Reasons, MD as Consulting Physician (Gastroenterology) Ronney Asters May, MD as Referring Physician (Otolaryngology) Berneice Gandy, MD as Referring Physician (Dermatology)  Allergies:  Allergies  Allergen Reactions  . Ace Inhibitors Cough  . Pneumococcal Vaccines    Medical History:  Past Medical History  Diagnosis Date  . Hyperlipidemia   . Hypertension   . Prediabetes   . Thyroid disease   . Vitamin D deficiency   . Allergy   . Rheumatoid arthritis    Surgical History:  Past Surgical History  Procedure Laterality Date  . Cochlear implant Right 2007  . Appendectomy    . Tubal ligation Bilateral   . Cholecystectomy    . Other surgical history      4 ear surgeries  . Ankle arthroscopy with open reduction internal fixation (orif) Right 2004  . Other surgical history      Right temple bone surgery   Family History:  Family History  Problem Relation Age of Onset  . Heart attack Mother   .  Hypertension Mother   . Lymphoma Mother    Social History:  History  Substance Use Topics  . Smoking status: Former Games developer  . Smokeless tobacco: Never Used  . Alcohol Use: Not on file    Review of Systems: Review of Systems  Constitutional: Negative.   HENT: Positive for hearing loss. Negative for congestion, ear discharge, ear pain, nosebleeds, sore throat and tinnitus.   Eyes: Negative.   Respiratory: Positive for cough, shortness of breath and wheezing. Negative for hemoptysis, sputum production and stridor.   Cardiovascular: Negative.  Negative for chest pain, palpitations and leg swelling.  Gastrointestinal: Positive for heartburn (has been trying to get off prilosec) and diarrhea. Negative for nausea, vomiting, abdominal pain, constipation, blood in stool and melena.  Genitourinary: Negative.   Musculoskeletal: Positive for myalgias. Negative for back pain, joint pain, falls and neck pain.  Skin: Negative.   Neurological: Negative.  Negative for  headaches.  Psychiatric/Behavioral: Negative for depression (trying to wean off depression med since not working anymore), suicidal ideas, hallucinations, memory loss and substance abuse. The patient is not nervous/anxious and does not have insomnia.      Physical Exam: Estimated body mass index is 31.48 kg/(m^2) as calculated from the following:   Height as of this encounter: 5' 6.5" (1.689 m).   Weight as of this encounter: 198 lb (89.812 kg). BP 128/80 mmHg  Pulse 60  Temp(Src) 97.7 F (36.5 C)  Resp 16  Ht 5' 6.5" (1.689 m)  Wt 198 lb (89.812 kg)  BMI 31.48 kg/m2 General Appearance: Well nourished, in no apparent distress.  Eyes: PERRLA, EOMs, conjunctiva no swelling or erythema, normal fundi and vessels.  Sinuses: No Frontal/maxillary tenderness  ENT/Mouth: Ext aud canals clear, normal light reflex with TMs without erythema, bulging. Good dentition. No erythema, swelling, or exudate on post pharynx. Tonsils not swollen or erythematous. Hearing normal.  Neck: Supple, thyroid normal. No bruits  Respiratory: Respiratory effort normal, + wheezing in bilateral lungs without rales, rhonchi,or stridor.  Cardio: RRR without murmurs, rubs or gallops. Brisk peripheral pulses without edema.  Chest: symmetric, with normal excursions and percussion.  Breasts: defer Abdomen: Soft, + epigastric tender, no guarding, rebound, hernias, masses, or organomegaly. .  Lymphatics: Non tender without lymphadenopathy.  Genitourinary: defer Musculoskeletal: Full ROM all peripheral extremities,5/5 strength, and normal gait.  Skin: Warm, dry without rashes, lesions, ecchymosis. Neuro: Cranial nerves intact, reflexes equal bilaterally. Normal muscle tone, no cerebellar symptoms. Sensation intact.  Psych: Awake and oriented X 3, normal affect, Insight and Judgment appropriate.   EKG: WNL, PVC's, no ST changes. AORTA SCAN: WNL    Angelica Parker 9:31 AM [facility]

## 2014-06-02 NOTE — Patient Instructions (Addendum)
For wheezing and cough- can be GERD/silent reflux- get back on the prilosec 1 pill daily If this does not help, do advair 1-2 time a day EVERY DAY, wash mouth afterwards, use albuterol AS NEEDED Can get on singualirm 1 pill daily for wheezing.      Bad carbs also include fruit juice, alcohol, and sweet tea. These are empty calories that do not signal to your brain that you are full.   Please remember the good carbs are still carbs which convert into sugar. So please measure them out no more than 1/2-1 cup of rice, oatmeal, pasta, and beans  Veggies are however free foods! Pile them on.   Not all fruit is created equal. Please see the list below, the fruit at the bottom is higher in sugars than the fruit at the top. Please avoid all dried fruits.

## 2014-06-03 LAB — BASIC METABOLIC PANEL WITH GFR
BUN: 15 mg/dL (ref 6–23)
CALCIUM: 9.9 mg/dL (ref 8.4–10.5)
CO2: 34 mEq/L — ABNORMAL HIGH (ref 19–32)
Chloride: 100 mEq/L (ref 96–112)
Creat: 0.75 mg/dL (ref 0.50–1.10)
GFR, EST NON AFRICAN AMERICAN: 79 mL/min
GFR, Est African American: >89 mL/min
Glucose, Bld: 97 mg/dL (ref 70–99)
Potassium: 4.4 mEq/L (ref 3.5–5.3)
Sodium: 139 mEq/L (ref 135–145)

## 2014-06-03 LAB — HEPATIC FUNCTION PANEL
ALK PHOS: 81 U/L (ref 39–117)
ALT: 30 U/L (ref 0–35)
AST: 22 U/L (ref 0–37)
Albumin: 4.3 g/dL (ref 3.5–5.2)
Bilirubin, Direct: 0.2 mg/dL (ref 0.0–0.3)
Indirect Bilirubin: 0.5 mg/dL (ref 0.2–1.2)
Total Bilirubin: 0.7 mg/dL (ref 0.2–1.2)
Total Protein: 6.8 g/dL (ref 6.0–8.3)

## 2014-06-03 LAB — URINALYSIS, ROUTINE W REFLEX MICROSCOPIC
Bilirubin Urine: NEGATIVE
Glucose, UA: NEGATIVE mg/dL
Hgb urine dipstick: NEGATIVE
KETONES UR: NEGATIVE mg/dL
Leukocytes, UA: NEGATIVE
NITRITE: NEGATIVE
PH: 7.5 (ref 5.0–8.0)
Protein, ur: NEGATIVE mg/dL
SPECIFIC GRAVITY, URINE: 1.01 (ref 1.005–1.030)
Urobilinogen, UA: 0.2 mg/dL (ref 0.0–1.0)

## 2014-06-03 LAB — LIPID PANEL
CHOLESTEROL: 164 mg/dL (ref 0–200)
HDL: 52 mg/dL (ref >39)
LDL Cholesterol: 85 mg/dL (ref 0–99)
Total CHOL/HDL Ratio: 3.2 Ratio
Triglycerides: 136 mg/dL (ref <150)
VLDL: 27 mg/dL (ref 0–40)

## 2014-06-03 LAB — VITAMIN D 25 HYDROXY (VIT D DEFICIENCY, FRACTURES): VIT D 25 HYDROXY: 57 ng/mL (ref 30–100)

## 2014-06-03 LAB — TSH: TSH: 2.294 u[IU]/mL (ref 0.350–4.500)

## 2014-06-03 LAB — HEMOGLOBIN A1C
HEMOGLOBIN A1C: 6 % — AB (ref <5.7)
MEAN PLASMA GLUCOSE: 126 mg/dL — AB (ref <117)

## 2014-06-03 LAB — MAGNESIUM: Magnesium: 1.8 mg/dL (ref 1.5–2.5)

## 2014-06-03 LAB — MICROALBUMIN / CREATININE URINE RATIO
Creatinine, Urine: 67.9 mg/dL
MICROALB UR: 0.7 mg/dL (ref <2.0)
Microalb Creat Ratio: 10.3 mg/g (ref 0.0–30.0)

## 2014-06-03 LAB — INSULIN, FASTING: INSULIN FASTING, SERUM: 10 u[IU]/mL (ref 2.0–19.6)

## 2014-08-18 ENCOUNTER — Other Ambulatory Visit: Payer: Self-pay | Admitting: Physician Assistant

## 2014-09-09 ENCOUNTER — Encounter: Payer: Self-pay | Admitting: Internal Medicine

## 2014-09-09 ENCOUNTER — Ambulatory Visit (INDEPENDENT_AMBULATORY_CARE_PROVIDER_SITE_OTHER): Payer: Medicare Other | Admitting: Internal Medicine

## 2014-09-09 ENCOUNTER — Ambulatory Visit: Payer: Self-pay | Admitting: Physician Assistant

## 2014-09-09 VITALS — BP 126/70 | HR 88 | Temp 98.2°F | Resp 16 | Ht 66.5 in | Wt 196.0 lb

## 2014-09-09 DIAGNOSIS — T7840XS Allergy, unspecified, sequela: Secondary | ICD-10-CM

## 2014-09-09 DIAGNOSIS — I10 Essential (primary) hypertension: Secondary | ICD-10-CM

## 2014-09-09 DIAGNOSIS — R059 Cough, unspecified: Secondary | ICD-10-CM

## 2014-09-09 DIAGNOSIS — R05 Cough: Secondary | ICD-10-CM

## 2014-09-09 DIAGNOSIS — E559 Vitamin D deficiency, unspecified: Secondary | ICD-10-CM

## 2014-09-09 DIAGNOSIS — N182 Chronic kidney disease, stage 2 (mild): Secondary | ICD-10-CM

## 2014-09-09 DIAGNOSIS — R7303 Prediabetes: Secondary | ICD-10-CM

## 2014-09-09 DIAGNOSIS — R7309 Other abnormal glucose: Secondary | ICD-10-CM

## 2014-09-09 DIAGNOSIS — E785 Hyperlipidemia, unspecified: Secondary | ICD-10-CM

## 2014-09-09 DIAGNOSIS — E039 Hypothyroidism, unspecified: Secondary | ICD-10-CM

## 2014-09-09 DIAGNOSIS — Z79899 Other long term (current) drug therapy: Secondary | ICD-10-CM

## 2014-09-09 LAB — HEMOGLOBIN A1C
Hgb A1c MFr Bld: 6.2 % — ABNORMAL HIGH (ref <5.7)
Mean Plasma Glucose: 131 mg/dL — ABNORMAL HIGH (ref <117)

## 2014-09-09 LAB — CBC WITH DIFFERENTIAL/PLATELET
Basophils Absolute: 0.1 10*3/uL (ref 0.0–0.1)
Basophils Relative: 1 % (ref 0–1)
Eosinophils Absolute: 0.5 10*3/uL (ref 0.0–0.7)
Eosinophils Relative: 7 % — ABNORMAL HIGH (ref 0–5)
HCT: 40.7 % (ref 36.0–46.0)
Hemoglobin: 13.7 g/dL (ref 12.0–15.0)
Lymphocytes Relative: 46 % (ref 12–46)
Lymphs Abs: 3.5 10*3/uL (ref 0.7–4.0)
MCH: 30.8 pg (ref 26.0–34.0)
MCHC: 33.7 g/dL (ref 30.0–36.0)
MCV: 91.5 fL (ref 78.0–100.0)
MONOS PCT: 5 % (ref 3–12)
MPV: 10.6 fL (ref 8.6–12.4)
Monocytes Absolute: 0.4 10*3/uL (ref 0.1–1.0)
NEUTROS ABS: 3.1 10*3/uL (ref 1.7–7.7)
NEUTROS PCT: 41 % — AB (ref 43–77)
PLATELETS: 269 10*3/uL (ref 150–400)
RBC: 4.45 MIL/uL (ref 3.87–5.11)
RDW: 14.8 % (ref 11.5–15.5)
WBC: 7.5 10*3/uL (ref 4.0–10.5)

## 2014-09-09 MED ORDER — MONTELUKAST SODIUM 10 MG PO TABS: 10.0000 mg | ORAL_TABLET | Freq: Every day | ORAL | Status: DC

## 2014-09-09 NOTE — Patient Instructions (Signed)

## 2014-09-09 NOTE — Progress Notes (Signed)
Patient ID: Angelica Parker, female   DOB: 08/14/1940, 74 y.o.   MRN: [id number] Assessment and Plan:  Hypertension:  -Continue medication,  -monitor blood pressure at home.  -Continue DASH diet.   -Reminder to go to the ER if any CP, SOB, nausea, dizziness, severe HA, changes vision/speech, left arm numbness and tingling, and jaw pain.  Cholesterol: -Continue diet and exercise.  -Check cholesterol.   Pre-diabetes: -Continue diet and exercise.  -Check A1C  Vitamin D Def: -check level -continue medications.   Continue diet and meds as discussed. Further disposition pending results of labs.  HPI 74 y.o. female  presents for 3 month follow up with hypertension, hyperlipidemia, prediabetes and vitamin D.   Her blood pressure has been controlled at home, today their BP is BP: 126/70 mmHg.   She does not workout.  She is not exercising.  She reports that it is too hard to go outside with her seasonal allergies.   She denies chest pain, shortness of breath, dizziness.   She is not on cholesterol medication and denies myalgias. Her cholesterol is at goal. The cholesterol last visit was:   Lab Results  Component Value Date   CHOL 164 06/02/2014   HDL 52 06/02/2014   LDLCALC 85 06/02/2014   TRIG 136 06/02/2014   CHOLHDL 3.2 06/02/2014     She has been working on diet and exercise for prediabetes, and denies foot ulcerations, hyperglycemia, hypoglycemia , increased appetite, nausea, paresthesia of the feet, polydipsia, polyuria, visual disturbances, vomiting and weight loss. Last A1C in the office was:  Lab Results  Component Value Date   HGBA1C 6.0* 06/02/2014    Patient is on Vitamin D supplement.  Lab Results  Component Value Date   VD25OH 57 06/02/2014     Patient is on Humira for RA.  She requests that we send her LFTs to Dr. Dareen Piano.    Current Medications:  Current Outpatient Prescriptions on File Prior to Visit  Medication Sig Dispense Refill  . adalimumab (HUMIRA) 40  MG/0.8ML injection Inject 40 mg into the skin every 14 (fourteen) days.    Marland Kitchen aspirin 81 MG chewable tablet Chew by mouth daily.    . calcium carbonate (OS-CAL) 600 MG TABS tablet Take 600 mg by mouth 2 (two) times daily with a meal.    . Cholecalciferol (VITAMIN D PO) Take 4,000 Int'l Units by mouth daily.    . citalopram (CELEXA) 40 MG tablet take 1 tablet by mouth once daily 90 tablet 0  . Fluticasone-Salmeterol (ADVAIR DISKUS) 100-50 MCG/DOSE AEPB Inhale 1 dose by mouth twice a day. 60 each 1  . folic acid (FOLVITE) 1 MG tablet Take by mouth daily.    Marland Kitchen glucose blood (FREESTYLE LITE) test strip Test Blood Sugar one time daily 100 each prn  . hydrochlorothiazide (HYDRODIURIL) 25 MG tablet take 1 tablet by mouth once daily FOR BLOOD PRESSURE AND FLUID 90 tablet 99  . levothyroxine (SYNTHROID, LEVOTHROID) 112 MCG tablet Take 1 tablet (112 mcg total) by mouth daily before breakfast. 90 tablet 3  . losartan (COZAAR) 100 MG tablet take 1 tablet by mouth once daily 90 tablet 2  . Magnesium 400 MG CAPS Take 400 mg by mouth 2 (two) times daily.    . methotrexate (RHEUMATREX) 2.5 MG tablet Take 2.5 mg by mouth. Patient takes 8 pills (2.5 mg x 8 = 20.0 mg) every Friday    . Multiple Vitamins-Minerals (MULTIVITAMIN PO) Take by mouth daily.    . Omega-3  Fatty Acids (FISH OIL PO) Take by mouth 2 (two) times daily.      No current facility-administered medications on file prior to visit.    Medical History:  Past Medical History  Diagnosis Date  . Hyperlipidemia   . Hypertension   . Prediabetes   . Thyroid disease   . Vitamin D deficiency   . Allergy   . Rheumatoid arthritis     Allergies:  Allergies  Allergen Reactions  . Ace Inhibitors Cough  . Pneumococcal Vaccines      Review of Systems:  Review of Systems  Constitutional: Positive for malaise/fatigue. Negative for fever and chills.  HENT: Positive for hearing loss and tinnitus. Negative for congestion, ear discharge, ear pain,  nosebleeds and sore throat.   Respiratory: Positive for wheezing (occasionally). Negative for cough, sputum production and shortness of breath.   Cardiovascular: Negative for chest pain, palpitations and leg swelling.  Gastrointestinal: Positive for heartburn. Negative for nausea, vomiting, abdominal pain, diarrhea, constipation, blood in stool and melena.  Genitourinary: Negative for dysuria, urgency, frequency and hematuria.  Skin: Negative.   Neurological: Positive for dizziness. Negative for sensory change, focal weakness, loss of consciousness, weakness and headaches.    Family history- Review and unchanged  Social history- Review and unchanged  Physical Exam: BP 126/70 mmHg  Pulse 88  Temp(Src) 98.2 F (36.8 C) (Temporal)  Resp 16  Ht 5' 6.5" (1.689 m)  Wt 196 lb (88.905 kg)  BMI 31.16 kg/m2 Wt Readings from Last 3 Encounters:  09/09/14 196 lb (88.905 kg)  06/02/14 198 lb (89.812 kg)  02/03/14 199 lb 12.8 oz (90.629 kg)    General Appearance: Well nourished well developed, in no apparent distress. Eyes: PERRLA, EOMs, conjunctiva no swelling or erythema ENT/Mouth: Ear canals normal without obstruction, swelling, erythma, discharge.  TMs normal bilaterally.  Oropharynx moist, clear, without exudate, or postoropharyngeal swelling. Neck: Supple, thyroid normal,no cervical adenopathy  Respiratory: Respiratory effort normal, Breath sounds clear A&P without rhonchi, or rale.  No retractions, no accessory usage.  Mild end expiratory wheeze Cardio: RRR with no MRGs. Brisk peripheral pulses without edema.  Abdomen: Soft, + BS,  Non tender, no guarding, rebound, hernias, masses. Musculoskeletal: Full ROM, 5/5 strength, Normal gait Skin: Warm, dry without rashes, lesions, ecchymosis.  Neuro: Awake and oriented X 3, Cranial nerves intact. Normal muscle tone, no cerebellar symptoms. Psych: Normal affect, Insight and Judgment appropriate.    FORCUCCI, Kristy Schomburg, PA-C 9:42  AM [facility]

## 2014-09-10 ENCOUNTER — Encounter: Payer: Self-pay | Admitting: *Deleted

## 2014-09-10 LAB — LIPID PANEL
CHOLESTEROL: 176 mg/dL (ref 0–200)
HDL: 45 mg/dL — AB (ref >=46)
LDL Cholesterol: 111 mg/dL — ABNORMAL HIGH (ref 0–99)
Total CHOL/HDL Ratio: 3.9 Ratio
Triglycerides: 101 mg/dL (ref <150)
VLDL: 20 mg/dL (ref 0–40)

## 2014-09-10 LAB — HEPATIC FUNCTION PANEL
ALT: 22 U/L (ref 0–35)
AST: 17 U/L (ref 0–37)
Albumin: 4.1 g/dL (ref 3.5–5.2)
Alkaline Phosphatase: 89 U/L (ref 39–117)
BILIRUBIN INDIRECT: 0.4 mg/dL (ref 0.2–1.2)
Bilirubin, Direct: 0.1 mg/dL (ref 0.0–0.3)
Total Bilirubin: 0.5 mg/dL (ref 0.2–1.2)
Total Protein: 6.8 g/dL (ref 6.0–8.3)

## 2014-09-10 LAB — INSULIN, FASTING: Insulin fasting, serum: 17.1 u[IU]/mL (ref 2.0–19.6)

## 2014-09-10 LAB — BASIC METABOLIC PANEL WITH GFR
BUN: 13 mg/dL (ref 6–23)
CO2: 33 mEq/L — ABNORMAL HIGH (ref 19–32)
Calcium: 9.9 mg/dL (ref 8.4–10.5)
Chloride: 99 mEq/L (ref 96–112)
Creat: 0.72 mg/dL (ref 0.50–1.10)
GFR, EST NON AFRICAN AMERICAN: 82 mL/min
GLUCOSE: 101 mg/dL — AB (ref 70–99)
Potassium: 4.2 mEq/L (ref 3.5–5.3)
Sodium: 138 mEq/L (ref 135–145)

## 2014-09-10 LAB — MAGNESIUM: MAGNESIUM: 1.8 mg/dL (ref 1.5–2.5)

## 2014-09-10 LAB — TSH: TSH: 2.03 u[IU]/mL (ref 0.350–4.500)

## 2014-09-10 LAB — VITAMIN D 25 HYDROXY (VIT D DEFICIENCY, FRACTURES): VIT D 25 HYDROXY: 60 ng/mL (ref 30–100)

## 2014-09-10 NOTE — Telephone Encounter (Signed)
This encounter was created in error - please disregard.

## 2014-11-05 ENCOUNTER — Other Ambulatory Visit: Payer: Self-pay | Admitting: Physician Assistant

## 2014-12-30 ENCOUNTER — Ambulatory Visit (INDEPENDENT_AMBULATORY_CARE_PROVIDER_SITE_OTHER): Payer: Medicare Other | Admitting: Internal Medicine

## 2014-12-30 ENCOUNTER — Encounter: Payer: Self-pay | Admitting: Internal Medicine

## 2014-12-30 VITALS — BP 124/82 | HR 88 | Temp 97.5°F | Resp 16 | Ht 66.5 in | Wt 197.6 lb

## 2014-12-30 DIAGNOSIS — E669 Obesity, unspecified: Secondary | ICD-10-CM | POA: Insufficient documentation

## 2014-12-30 DIAGNOSIS — R7303 Prediabetes: Secondary | ICD-10-CM

## 2014-12-30 DIAGNOSIS — Z1331 Encounter for screening for depression: Secondary | ICD-10-CM

## 2014-12-30 DIAGNOSIS — N182 Chronic kidney disease, stage 2 (mild): Secondary | ICD-10-CM

## 2014-12-30 DIAGNOSIS — R7309 Other abnormal glucose: Secondary | ICD-10-CM

## 2014-12-30 DIAGNOSIS — Z6831 Body mass index (BMI) 31.0-31.9, adult: Secondary | ICD-10-CM

## 2014-12-30 DIAGNOSIS — R6889 Other general symptoms and signs: Secondary | ICD-10-CM

## 2014-12-30 DIAGNOSIS — Z0001 Encounter for general adult medical examination with abnormal findings: Secondary | ICD-10-CM | POA: Diagnosis not present

## 2014-12-30 DIAGNOSIS — I10 Essential (primary) hypertension: Secondary | ICD-10-CM

## 2014-12-30 DIAGNOSIS — E785 Hyperlipidemia, unspecified: Secondary | ICD-10-CM

## 2014-12-30 DIAGNOSIS — M069 Rheumatoid arthritis, unspecified: Secondary | ICD-10-CM

## 2014-12-30 DIAGNOSIS — Z79899 Other long term (current) drug therapy: Secondary | ICD-10-CM

## 2014-12-30 DIAGNOSIS — E039 Hypothyroidism, unspecified: Secondary | ICD-10-CM

## 2014-12-30 DIAGNOSIS — Z9181 History of falling: Secondary | ICD-10-CM

## 2014-12-30 DIAGNOSIS — E559 Vitamin D deficiency, unspecified: Secondary | ICD-10-CM

## 2014-12-30 LAB — CBC WITH DIFFERENTIAL/PLATELET
BASOS ABS: 0.1 10*3/uL (ref 0.0–0.1)
Basophils Relative: 1 % (ref 0–1)
Eosinophils Absolute: 0.5 10*3/uL (ref 0.0–0.7)
Eosinophils Relative: 6 % — ABNORMAL HIGH (ref 0–5)
HCT: 39.9 % (ref 36.0–46.0)
HEMOGLOBIN: 13.5 g/dL (ref 12.0–15.0)
LYMPHS ABS: 3.1 10*3/uL (ref 0.7–4.0)
LYMPHS PCT: 39 % (ref 12–46)
MCH: 31.6 pg (ref 26.0–34.0)
MCHC: 33.8 g/dL (ref 30.0–36.0)
MCV: 93.4 fL (ref 78.0–100.0)
MONOS PCT: 7 % (ref 3–12)
MPV: 10.2 fL (ref 8.6–12.4)
Monocytes Absolute: 0.6 10*3/uL (ref 0.1–1.0)
NEUTROS ABS: 3.7 10*3/uL (ref 1.7–7.7)
NEUTROS PCT: 47 % (ref 43–77)
PLATELETS: 287 10*3/uL (ref 150–400)
RBC: 4.27 MIL/uL (ref 3.87–5.11)
RDW: 15.2 % (ref 11.5–15.5)
WBC: 7.9 10*3/uL (ref 4.0–10.5)

## 2014-12-30 LAB — HEPATIC FUNCTION PANEL
ALT: 21 U/L (ref 6–29)
AST: 18 U/L (ref 10–35)
Albumin: 3.6 g/dL (ref 3.6–5.1)
Alkaline Phosphatase: 75 U/L (ref 33–130)
BILIRUBIN INDIRECT: 0.3 mg/dL (ref 0.2–1.2)
Bilirubin, Direct: 0.1 mg/dL (ref <=0.2)
Total Bilirubin: 0.4 mg/dL (ref 0.2–1.2)
Total Protein: 6.4 g/dL (ref 6.1–8.1)

## 2014-12-30 LAB — TSH: TSH: 1.178 u[IU]/mL (ref 0.350–4.500)

## 2014-12-30 LAB — BASIC METABOLIC PANEL WITH GFR
BUN: 17 mg/dL (ref 7–25)
CHLORIDE: 98 mmol/L (ref 98–110)
CO2: 29 mmol/L (ref 20–31)
Calcium: 9.5 mg/dL (ref 8.6–10.4)
Creat: 0.83 mg/dL (ref 0.60–0.93)
GFR, Est African American: 80 mL/min (ref >=60)
GFR, Est Non African American: 69 mL/min (ref >=60)
GLUCOSE: 95 mg/dL (ref 65–99)
Potassium: 4.5 mmol/L (ref 3.5–5.3)
SODIUM: 137 mmol/L (ref 135–146)

## 2014-12-30 LAB — LIPID PANEL
CHOL/HDL RATIO: 3.3 ratio (ref <=5.0)
Cholesterol: 151 mg/dL (ref 125–200)
HDL: 46 mg/dL (ref >=46)
LDL CALC: 83 mg/dL (ref <130)
Triglycerides: 108 mg/dL (ref <150)
VLDL: 22 mg/dL (ref <30)

## 2014-12-30 LAB — MAGNESIUM: Magnesium: 1.8 mg/dL (ref 1.5–2.5)

## 2014-12-30 NOTE — Patient Instructions (Signed)
Recommend Adult Low dose Aspirin or coated  Aspirin 81 mg daily   To reduce risk of Colon Cancer 20 %,   Skin Cancer 26 % ,   Melanoma 46%   and   Pancreatic cancer 60% ++++++++++++++++++ Vitamin D goal is between 70-100.   Please make sure that you are taking your Vitamin D as directed.   It is very important as a natural anti-inflammatory   helping hair, skin, and nails, as well as reducing stroke and heart attack risk.   It helps your bones and helps with mood.  It also decreases numerous cancer risks so please take it as directed.   Low Vit D is associated with a 200-300% higher risk for CANCER   and 200-300% higher risk for HEART   ATTACK  &  STROKE.   ......................................  It is also associated with higher death rate at younger ages,   autoimmune diseases like Rheumatoid arthritis, Lupus, Multiple Sclerosis.     Also many other serious conditions, like depression, Alzheimer's  Dementia, infertility, muscle aches, fatigue, fibromyalgia - just to name a few.  +++++++++++++++++++  Recommend the book "The END of DIETING" by Dr Joel Fuhrman   & the book "The END of DIABETES " by Dr Joel Fuhrman  At [employer] - get book & Audio CD's     Being diabetic has a  300% increased risk for heart attack, stroke, cancer, and alzheimer- type vascular dementia. It is very important that you work harder with diet by avoiding all foods that are white. Avoid white rice (brown & wild rice is OK), white potatoes (sweetpotatoes in moderation is OK), White bread or wheat bread or anything made out of white flour like bagels, donuts, rolls, buns, biscuits, cakes, pastries, cookies, pizza crust, and pasta (made from white flour & egg whites) - vegetarian pasta or spinach or wheat pasta is OK. Multigrain breads like Arnold's or [employer], or multigrain sandwich thins or flatbreads.  Diet, exercise and weight loss can reverse and cure diabetes in the early stages.   Diet, exercise and weight loss is very important in the control and prevention of complications of diabetes which affects every system in your body, ie. Brain - dementia/stroke, eyes - glaucoma/blindness, heart - heart attack/heart failure, kidneys - dialysis, stomach - gastric paralysis, intestines - malabsorption, nerves - severe painful neuritis, circulation - gangrene & loss of a leg(s), and finally cancer and Alzheimers.    I recommend avoid fried & greasy foods,  sweets/candy, white rice (brown or wild rice or Quinoa is OK), white potatoes (sweet potatoes are OK) - anything made from white flour - bagels, doughnuts, rolls, buns, biscuits,white and wheat breads, pizza crust and traditional pasta made of white flour & egg white(vegetarian pasta or spinach or wheat pasta is OK).  Multi-grain bread is OK - like multi-grain flat bread or sandwich thins. Avoid alcohol in excess. Exercise is also important.    Eat all the vegetables you want - avoid meat, especially red meat and dairy - especially cheese.  Cheese is the most concentrated form of trans-fats which is the worst thing to clog up our arteries. Veggie cheese is OK which can be found in the fresh produce section at [employer] or [employer] or [employer]  ++++++++++++++++++++++++++   Preventive Care for Adults  A healthy lifestyle and preventive care can promote health and wellness. Preventive health guidelines for women include the following key practices.  A routine yearly physical is a good way   to check with your health care provider about your health and preventive screening. It is a chance to share any concerns and updates on your health and to receive a thorough exam.  Visit your dentist for a routine exam and preventive care every 6 months. Brush your teeth twice a day and floss once a day. Good oral hygiene prevents tooth decay and gum disease.  The frequency of eye exams is based on your age, health, family medical history, use  of contact lenses, and other factors. Follow your health care provider's recommendations for frequency of eye exams.  Eat a healthy diet. Foods like vegetables, fruits, whole grains, low-fat dairy products, and lean protein foods contain the nutrients you need without too many calories. Decrease your intake of foods high in solid fats, added sugars, and salt. Eat the right amount of calories for you.Get information about a proper diet from your health care provider, if necessary.  Regular physical exercise is one of the most important things you can do for your health. Most adults should get at least 150 minutes of moderate-intensity exercise (any activity that increases your heart rate and causes you to sweat) each week. In addition, most adults need muscle-strengthening exercises on 2 or more days a week.  Maintain a healthy weight. The body mass index (BMI) is a screening tool to identify possible weight problems. It provides an estimate of body fat based on height and weight. Your health care provider can find your BMI and can help you achieve or maintain a healthy weight.For adults 20 years and older:  A BMI below 18.5 is considered underweight.  A BMI of 18.5 to 24.9 is normal.  A BMI of 25 to 29.9 is considered overweight.  A BMI of 30 and above is considered obese.  Maintain normal blood lipids and cholesterol levels by exercising and minimizing your intake of saturated fat. Eat a balanced diet with plenty of fruit and vegetables. If your lipid or cholesterol levels are high, you are over 50, or you are at high risk for heart disease, you may need your cholesterol levels checked more frequently.Ongoing high lipid and cholesterol levels should be treated with medicines if diet and exercise are not working.  If you smoke, find out from your health care provider how to quit. If you do not use tobacco, do not start.  Lung cancer screening is recommended for adults aged 17-80 years who are  at high risk for developing lung cancer because of a history of smoking. A yearly low-dose CT scan of the lungs is recommended for people who have at least a 30-pack-year history of smoking and are a current smoker or have quit within the past 15 years. A pack year of smoking is smoking an average of 1 pack of cigarettes a day for 1 year (for example: 1 pack a day for 30 years or 2 packs a day for 15 years). Yearly screening should continue until the smoker has stopped smoking for at least 15 years. Yearly screening should be stopped for people who develop a health problem that would prevent them from having lung cancer treatment.  Avoid use of street drugs. Do not share needles with anyone. Ask for help if you need support or instructions about stopping the use of drugs.  High blood pressure causes heart disease and increases the risk of stroke.  Ongoing high blood pressure should be treated with medicines if weight loss and exercise do not work.  If you are 55-79  years old, ask your health care provider if you should take aspirin to prevent strokes.  Diabetes screening involves taking a blood sample to check your fasting blood sugar level. This should be done once every 3 years, after age 3, if you are within normal weight and without risk factors for diabetes. Testing should be considered at a younger age or be carried out more frequently if you are overweight and have at least 1 risk factor for diabetes.  Breast cancer screening is essential preventive care for women. You should practice "breast self-awareness." This means understanding the normal appearance and feel of your breasts and may include breast self-examination. Any changes detected, no matter how small, should be reported to a health care provider. Women in their 10s and 30s should have a clinical breast exam (CBE) by a health care provider as part of a regular health exam every 1 to 3 years. After age 67, women should have a CBE every  year. Starting at age 66, women should consider having a mammogram (breast X-ray test) every year. Women who have a family history of breast cancer should talk to their health care provider about genetic screening. Women at a high risk of breast cancer should talk to their health care providers about having an MRI and a mammogram every year.  Breast cancer gene (BRCA)-related cancer risk assessment is recommended for women who have family members with BRCA-related cancers. BRCA-related cancers include breast, ovarian, tubal, and peritoneal cancers. Having family members with these cancers may be associated with an increased risk for harmful changes (mutations) in the breast cancer genes BRCA1 and BRCA2. Results of the assessment will determine the need for genetic counseling and BRCA1 and BRCA2 testing.  Routine pelvic exams to screen for cancer are no longer recommended for nonpregnant women who are considered low risk for cancer of the pelvic organs (ovaries, uterus, and vagina) and who do not have symptoms. Ask your health care provider if a screening pelvic exam is right for you.  If you have had past treatment for cervical cancer or a condition that could lead to cancer, you need Pap tests and screening for cancer for at least 20 years after your treatment. If Pap tests have been discontinued, your risk factors (such as having a new sexual partner) need to be reassessed to determine if screening should be resumed. Some women have medical problems that increase the chance of getting cervical cancer. In these cases, your health care provider may recommend more frequent screening and Pap tests.    Colorectal cancer can be detected and often prevented. Most routine colorectal cancer screening begins at the age of 97 years and continues through age 68 years. However, your health care provider may recommend screening at an earlier age if you have risk factors for colon cancer. On a yearly basis, your health  care provider may provide home test kits to check for hidden blood in the stool. Use of a small camera at the end of a tube, to directly examine the colon (sigmoidoscopy or colonoscopy), can detect the earliest forms of colorectal cancer. Talk to your health care provider about this at age 65, when routine screening begins. Direct exam of the colon should be repeated every 5-10 years through age 81 years, unless early forms of pre-cancerous polyps or small growths are found.  Osteoporosis is a disease in which the bones lose minerals and strength with aging. This can result in serious bone fractures or breaks. The risk of osteoporosis  can be identified using a bone density scan. Women ages 13 years and over and women at risk for fractures or osteoporosis should discuss screening with their health care providers. Ask your health care provider whether you should take a calcium supplement or vitamin D to reduce the rate of osteoporosis.  Menopause can be associated with physical symptoms and risks. Hormone replacement therapy is available to decrease symptoms and risks. You should talk to your health care provider about whether hormone replacement therapy is right for you.  Use sunscreen. Apply sunscreen liberally and repeatedly throughout the day. You should seek shade when your shadow is shorter than you. Protect yourself by wearing long sleeves, pants, a wide-brimmed hat, and sunglasses year round, whenever you are outdoors.  Once a month, do a whole body skin exam, using a mirror to look at the skin on your back. Tell your health care provider of new moles, moles that have irregular borders, moles that are larger than a pencil eraser, or moles that have changed in shape or color.  Stay current with required vaccines (immunizations).  Influenza vaccine. All adults should be immunized every year.  Tetanus, diphtheria, and acellular pertussis (Td, Tdap) vaccine. Pregnant women should receive 1 dose of  Tdap vaccine during each pregnancy. The dose should be obtained regardless of the length of time since the last dose. Immunization is preferred during the 27th-36th week of gestation. An adult who has not previously received Tdap or who does not know her vaccine status should receive 1 dose of Tdap. This initial dose should be followed by tetanus and diphtheria toxoids (Td) booster doses every 10 years. Adults with an unknown or incomplete history of completing a 3-dose immunization series with Td-containing vaccines should begin or complete a primary immunization series including a Tdap dose. Adults should receive a Td booster every 10 years.    Zoster vaccine. One dose is recommended for adults aged 35 years or older unless certain conditions are present.    Pneumococcal 13-valent conjugate (PCV13) vaccine. When indicated, a person who is uncertain of her immunization history and has no record of immunization should receive the PCV13 vaccine. An adult aged 71 years or older who has certain medical conditions and has not been previously immunized should receive 1 dose of PCV13 vaccine. This PCV13 should be followed with a dose of pneumococcal polysaccharide (PPSV23) vaccine. The PPSV23 vaccine dose should be obtained at least 8 weeks after the dose of PCV13 vaccine. An adult aged 63 years or older who has certain medical conditions and previously received 1 or more doses of PPSV23 vaccine should receive 1 dose of PCV13. The PCV13 vaccine dose should be obtained 1 or more years after the last PPSV23 vaccine dose.    Pneumococcal polysaccharide (PPSV23) vaccine. When PCV13 is also indicated, PCV13 should be obtained first. All adults aged 40 years and older should be immunized. An adult younger than age 62 years who has certain medical conditions should be immunized. Any person who resides in a nursing home or long-term care facility should be immunized. An adult smoker should be immunized. People with an  immunocompromised condition and certain other conditions should receive both PCV13 and PPSV23 vaccines. People with human immunodeficiency virus (HIV) infection should be immunized as soon as possible after diagnosis. Immunization during chemotherapy or radiation therapy should be avoided. Routine use of PPSV23 vaccine is not recommended for American Indians, [place]atives, or people younger than 65 years unless there are medical conditions that require  PPSV23 vaccine. When indicated, people who have unknown immunization and have no record of immunization should receive PPSV23 vaccine. One-time revaccination 5 years after the first dose of PPSV23 is recommended for people aged 19-64 years who have chronic kidney failure, nephrotic syndrome, asplenia, or immunocompromised conditions. People who received 1-2 doses of PPSV23 before age 6 years should receive another dose of PPSV23 vaccine at age 81 years or later if at least 5 years have passed since the previous dose. Doses of PPSV23 are not needed for people immunized with PPSV23 at or after age 25 years.   Preventive Services / Frequency  Ages 65 years and over  Blood pressure check.  Lipid and cholesterol check.  Lung cancer screening. / Every year if you are aged 72-80 years and have a 30-pack-year history of smoking and currently smoke or have quit within the past 15 years. Yearly screening is stopped once you have quit smoking for at least 15 years or develop a health problem that would prevent you from having lung cancer treatment.  Clinical breast exam.** / Every year after age 28 years.  BRCA-related cancer risk assessment.** / For women who have family members with a BRCA-related cancer (breast, ovarian, tubal, or peritoneal cancers).  Mammogram.** / Every year beginning at age 67 years and continuing for as long as you are in good health. Consult with your health care provider.  Pap test.** / Every 3 years starting at age 44 years  through age 38 or 31 years with 3 consecutive normal Pap tests. Testing can be stopped between 65 and 70 years with 3 consecutive normal Pap tests and no abnormal Pap or HPV tests in the past 10 years.  Fecal occult blood test (FOBT) of stool. / Every year beginning at age 97 years and continuing until age 31 years. You may not need to do this test if you get a colonoscopy every 10 years.  Flexible sigmoidoscopy or colonoscopy.** / Every 5 years for a flexible sigmoidoscopy or every 10 years for a colonoscopy beginning at age 3 years and continuing until age 60 years.  Hepatitis C blood test.** / For all people born from [birth year] through 1966 and any individual with known risks for hepatitis C.  Osteoporosis screening.** / A one-time screening for women ages 44 years and over and women at risk for fractures or osteoporosis.  Skin self-exam. / Monthly.  Influenza vaccine. / Every year.  Tetanus, diphtheria, and acellular pertussis (Tdap/Td) vaccine.** / 1 dose of Td every 10 years.  Zoster vaccine.** / 1 dose for adults aged 72 years or older.  Pneumococcal 13-valent conjugate (PCV13) vaccine.** / Consult your health care provider.  Pneumococcal polysaccharide (PPSV23) vaccine.** / 1 dose for all adults aged 64 years and older. Screening for abdominal aortic aneurysm (AAA)  by ultrasound is recommended for people who have history of high blood pressure or who are current or former smokers.

## 2014-12-30 NOTE — Progress Notes (Signed)
Patient ID: Angelica Parker, female   DOB: [birth year], 74 y.o.   MRN: [id number]  MEDICARE ANNUAL WELLNESS VISIT AND OV  Assessment:   1. Essential hypertension  - TSH  2. Hyperlipidemia  - Lipid panel  3. Prediabetes  - Hemoglobin A1c - Insulin, random  4. Vitamin D deficiency  - Vit D  25 hydroxy   5. Hypothyroidism  - TSH  6. CKD (chronic kidney disease) stage 2, GFR 60-89 ml/min   7. Rheumatoid arthritis   8. Depression screen   9. At low risk for fall   10. Encounter for general adult medical examination with abnormal findings   11. Medication management  - CBC with Differential/Platelet - BASIC METABOLIC PANEL WITH GFR - Hepatic function panel - Magnesium  12. Morbid obesity (BMI 31.42)   13. BMI 31.0-31.9,adult  Plan:   During the course of the visit the patient was educated and counseled about appropriate screening and preventive services including:    Pneumococcal vaccine   Influenza vaccine  Td vaccine  Screening electrocardiogram  Bone densitometry screening  Colorectal cancer screening  Diabetes screening  Glaucoma screening  Nutrition counseling   Advanced directives: requested  Screening recommendations, referrals: Vaccinations:  Immunization History  Administered Date(s) Administered  . Influenza, High Dose Seasonal PF 02/03/2014  . Pneumococcal Conjugate-13 07/16/2014  . Pneumococcal Polysaccharide-23 05/30/2003  . Td 05/29/2005  . Zoster 05/29/2006  Hep B vaccine not indicated  Nutrition assessed and recommended  Colonoscopy 2010 Recommended yearly ophthalmology/optometry visit for glaucoma screening and checkup Recommended yearly dental visit for hygiene and checkup Advanced directives - yes  Conditions/risks identified: BMI: Discussed weight loss, diet, and increase physical activity.  Increase physical activity: AHA recommends 150 minutes of physical activity a week.  Medications reviewedPreDiabetes is  not at goal, ACE/ARB therapy: Yes. Urinary Incontinence is not an issue: discussed non pharmacology and pharmacology options.  Fall risk: low- discussed PT, home fall assessment, medications.   Subjective:    Angelica Parker  presents for [employer] Visit and OV.  Date of last medicare wellness visit 11/03/2013.  This very nice 74 y.o.female also presents for 3 month follow up with Hypertension, Hyperlipidemia, Hypothyroidism, Pre-Diabetes and Vitamin D Deficiency. Patient has Rheumatoid Arthritis and is relatively asx on current Tx.    Patient is treated for HTN & BP has been controlled at home. Today's BP: 124/82 mmHg. Patient has had no complaints of any cardiac type chest pain, palpitations, dyspnea/orthopnea/PND, dizziness, claudication, or dependent edema.   Hyperlipidemia is controlled with diet & meds. Patient denies myalgias or other med SE's. Last Lipids were  Cholesterol 176; HDL 45*; LDL 111*; Triglycerides 101 on 09/09/2014.   Also, the patient has history of PreDiabetes and has had no symptoms of reactive hypoglycemia, diabetic polys, paresthesias or visual blurring.  Last A1c was  6.2% on 09/09/2014.    P_atient has been on thyroid replacement many years circa 1990's. Further, the patient also has history of Vitamin D Deficiency and supplements vitamin D without any suspected side-effects. Last vitamin D was  60 on 09/09/2014.   Names of Other Physician/Practitioners you currently use: 1. [facility] here for primary care 2. [facility], eye doctor, last visit Jan, 2016 3. No dentist, dentures - last visit years  Patient Care Team: Lucky Cowboy, MD as PCP - General (Internal Medicine) Lurena Nida, MD / Alben Deeds, MD as Consulting Physician (Rheumatology) Shea Evans, MD as Consulting Physician (Obstetrics and  Gynecology) Bernette Redbird, MD as Consulting Physician (Gastroenterology) Ronney Asters May,  MD as Referring Physician (Otolaryngology) Berneice Gandy, MD as Referring Physician (Dermatology)  Medication Review: Medication Sig  . aspirin 81 MG chewable tablet Chew by mouth daily.  . calcium carbonate (OS-CAL) 600 MG TABS tablet Take 600 mg by mouth 2 (two) times daily with a meal.  . Cholecalciferol (VITAMIN D PO) Take 4,000 Int'l Units by mouth daily.  . citalopram (CELEXA) 40 MG tablet take 1 tablet by mouth once daily  . ADVAIR DISKUS 100-50  Inhale 1 dose by mouth twice a day.  . folic acid  1 MG tablet Take by mouth daily.  . hctz 25 MG tablet take 1 tablet by mouth once daily FOR BLOOD PRESSURE AND FLUID  . levothyroxine  112 MCG tablet Take 1 tablet (112 mcg total) by mouth daily before breakfast.  . losartan (COZAAR) 100 MG tablet take 1 tablet by mouth once daily  . Magnesium 400 MG CAPS Take 400 mg by mouth 2 (two) times daily.  . methotrexate (RHEUMATREX) 2.5 MG tablet Take 2.5 mg by mouth. Patient takes 8 pills (2.5 mg x 8 = 20.0 mg) every Friday  . montelukast (SINGULAIR) 10 MG tablet Take 1 tablet (10 mg total) by mouth daily.  . Multiple Vitamins-Minerals (MULTIVITAMIN PO) Take by mouth daily.  . Omega-3 Fatty Acids (FISH OIL PO) Take by mouth 2 (two) times daily.    Allergies  Allergen Reactions  . Ace Inhibitors Cough  . Pneumococcal Vaccines    Current Problems (verified) Patient Active Problem List   Diagnosis Date Noted  . Morbid obesity (BMI 31.42) 12/30/2014  . CKD (chronic kidney disease) stage 2, GFR 60-89 ml/min 06/02/2014  . Medication management 07/31/2013  . Prediabetes   . Hyperlipidemia   . Hypertension   . Hypothyroidism   . Vitamin D deficiency   . Rheumatoid arthritis    Screening Tests Health Maintenance  Topic Date Due  . OPHTHALMOLOGY EXAM  06/03/1950  . INFLUENZA VACCINE  12/28/2014  . HEMOGLOBIN A1C  03/11/2015  . TETANUS/TDAP  05/30/2015  . FOOT EXAM  06/03/2015  . URINE MICROALBUMIN  06/03/2015  . PNA vac Low Risk  Adult (2 of 2 - PPSV23) 07/17/2015  . COLONOSCOPY  05/29/2018  . DEXA SCAN  Completed  . ZOSTAVAX  Completed   Immunization History  Administered Date(s) Administered  . Influenza, High Dose Seasonal PF 02/03/2014  . Pneumococcal Conjugate-13 07/16/2014  . Pneumococcal Polysaccharide-23 05/30/2003  . Td 05/29/2005  . Zoster 05/29/2006   Preventative care: Last colonoscopy: 2010  Past Medical History  Diagnosis Date  . Hyperlipidemia   . Hypertension   . Prediabetes   . Thyroid disease   . Vitamin D deficiency   . Allergy   . Rheumatoid arthritis    Past Surgical History  Procedure Laterality Date  . Cochlear implant Right 2007  . Appendectomy    . Tubal ligation Bilateral   . Cholecystectomy    . Other surgical history      4 ear surgeries  . Ankle arthroscopy with open reduction internal fixation (orif) Right 2004  . Other surgical history      Right temple bone surgery   Risk Factors: Tobacco History  Substance Use Topics  . Smoking status: Former Smoker    Quit date: 09/08/1988  . Smokeless tobacco: Never Used  . Alcohol Use: No   She does not smoke.  Patient is a former smoker. Are there  smokers in your home (other than you)?  No Alcohol Current alcohol use: none  Caffeine Current caffeine use: coffee 2 cups /day  Exercise Current exercise: housecleaning, walking and yard work  Nutrition/Diet Current diet: in general, a "healthy" diet    Cardiac risk factors: advanced age (older than 59 for men, 17 for women), dyslipidemia, hypertension, sedentary lifestyle and smoking/ tobacco exposure.  Depression Screen (Note: if answer to either of the following is "Yes", a more complete depression screening is indicated)   Q1: Over the past two weeks, have you felt down, depressed or hopeless? No  Q2: Over the past two weeks, have you felt little interest or pleasure in doing things? No  Have you lost interest or pleasure in daily life? No  Do you often  feel hopeless? No  Do you cry easily over simple problems? No  Activities of Daily Living In your present state of health, do you have any difficulty performing the following activities?:  Driving? No Managing money?  No Feeding yourself? No Getting from bed to chair? No Climbing a flight of stairs? No Preparing food and eating?: No Bathing or showering? No Getting dressed: No Getting to the toilet? No Using the toilet:No Moving around from place to place: No In the past year have you fallen or had a near fall?:No   Are you sexually active?  No  Do you have more than one partner?  No  Vision Difficulties: No  Hearing Difficulties: No Do you often ask people to speak up or repeat themselves? No Do you experience ringing or noises in your ears? No Do you have difficulty understanding soft or whispered voices? Sometimes.  Cognition  Do you feel that you have a problem with memory?No  Do you often misplace items? No  Do you feel safe at home?  Yes  Advanced directives Does patient have a Health Care Power of Attorney? Yes Does patient have a Living Will? Yes  ROS: Constitutional: Denies fever, chills, weight loss/gain, headaches, insomnia, fatigue, night sweats, and change in appetite. Eyes: Denies redness, blurred vision, diplopia, discharge, itchy, watery eyes.  ENT: Denies discharge, congestion, post nasal drip, epistaxis, sore throat, earache, hearing loss, dental pain, Tinnitus, Vertigo, Sinus pain, snoring.  Cardio: Denies chest pain, palpitations, irregular heartbeat, syncope, dyspnea, diaphoresis, orthopnea, PND, claudication, edema Respiratory: denies cough, dyspnea, DOE, pleurisy, hoarseness, laryngitis, wheezing.  Gastrointestinal: Denies dysphagia, heartburn, reflux, water brash, pain, cramps, nausea, vomiting, bloating, diarrhea, constipation, hematemesis, melena, hematochezia, jaundice, hemorrhoids Genitourinary: Denies dysuria, frequency, urgency, nocturia,  hesitancy, discharge, hematuria, flank pain Breast: Breast lumps, nipple discharge, bleeding.  Musculoskeletal: Denies arthralgia, myalgia, stiffness, Jt. Swelling, pain, limp, and strain/sprain. Denies falls. Skin: Denies puritis, rash, hives, warts, acne, eczema, changing in skin lesion Neuro: No weakness, tremor, incoordination, spasms, paresthesia, pain Psychiatric: Denies confusion, memory loss, sensory loss. Denies Depression. Endocrine: Denies change in weight, skin, hair change, nocturia, and paresthesia, diabetic polys, visual blurring, hyper / hypo glycemic episodes.  Heme/Lymph: No excessive bleeding, bruising, enlarged lymph nodes  Objective:     BP 124/82 mmHg  Pulse 88  Temp(Src) 97.5 F (36.4 C)  Resp 16  Ht 5' 6.5" (1.689 m)  Wt 197 lb 9.6 oz (89.631 kg)  BMI 31.42 kg/m2  General Appearance: Well nourished, alert, WD/WN, female and in no apparent distress. Eyes: PERRLA, EOMs, conjunctiva no swelling or erythema, normal fundi and vessels. Sinuses: No frontal/maxillary tenderness ENT/Mouth: EACs patent / TMs  nl. Nares clear without erythema, swelling, mucoid exudates. Oral  hygiene is good. No erythema, swelling, or exudate. Tongue normal, non-obstructing. Tonsils not swollen or erythematous. Hearing normal.  Neck: Supple, thyroid normal. No bruits, nodes or JVD. Respiratory: Respiratory effort normal.  BS equal and clear bilateral without rales, rhonci, wheezing or stridor. Cardio: Heart sounds are normal with regular rate and rhythm and no murmurs, rubs or gallops. Peripheral pulses are normal and equal bilaterally without edema. No aortic or femoral bruits. Chest: symmetric with normal excursions and percussion. Breasts: Symmetric, without lumps, nipple discharge, retractions, or fibrocystic changes.  Abdomen: Flat, soft  with nl bowel sounds. Nontender, no guarding, rebound, hernias, masses, or organomegaly.  Lymphatics: Non tender without lymphadenopathy.   Genitourinary:  Musculoskeletal: Full ROM all peripheral extremities, joint stability, 5/5 strength, and normal gait. Skin: Warm and dry without rashes, lesions, cyanosis, clubbing or  ecchymosis.  Neuro: Cranial nerves intact, reflexes equal bilaterally. Normal muscle tone, no cerebellar symptoms. Sensation intact.  Pysch: Alert and oriented X 3, normal affect, Insight and Judgment appropriate.   Cognitive Testing  Alert? Yes  Normal Appearance?Yes  Oriented to person? Yes  Place? Yes   Time? Yes  Recall of three objects?  Yes  Can perform simple calculations? Yes  Displays appropriate judgment? Yes  Can read the correct time from a watch/clock?Yes  Medicare Attestation I have personally reviewed: The patient's medical and social history Their use of alcohol, tobacco or illicit drugs Their current medications and supplements The patient's functional ability including ADLs,fall risks, home safety risks, cognitive, and hearing and visual impairment Diet and physical activities Evidence for depression or mood disorders  The patient's weight, height, BMI, and visual acuity have been recorded in the chart.  I have made referrals, counseling, and provided education to the patient based on review of the above and I have provided the patient with a written personalized care plan for preventive services.  Over 40 minutes of exam, counseling, chart review was performed.  Sim Choquette DAVID, MD   12/30/2014

## 2014-12-31 LAB — INSULIN, RANDOM: Insulin: 16.4 u[IU]/mL (ref 2.0–19.6)

## 2014-12-31 LAB — HEMOGLOBIN A1C
Hgb A1c MFr Bld: 6.1 % — ABNORMAL HIGH (ref <5.7)
Mean Plasma Glucose: 128 mg/dL — ABNORMAL HIGH (ref <117)

## 2014-12-31 LAB — VITAMIN D 25 HYDROXY (VIT D DEFICIENCY, FRACTURES): Vit D, 25-Hydroxy: 49 ng/mL (ref 30–100)

## 2015-01-11 ENCOUNTER — Other Ambulatory Visit: Payer: Self-pay | Admitting: Internal Medicine

## 2015-03-09 ENCOUNTER — Other Ambulatory Visit: Payer: Self-pay | Admitting: Internal Medicine

## 2015-04-07 ENCOUNTER — Other Ambulatory Visit: Payer: Self-pay | Admitting: Internal Medicine

## 2015-05-06 ENCOUNTER — Other Ambulatory Visit: Payer: Self-pay | Admitting: Physician Assistant

## 2015-05-11 ENCOUNTER — Other Ambulatory Visit: Payer: Self-pay | Admitting: Physician Assistant

## 2015-05-13 DIAGNOSIS — R7309 Other abnormal glucose: Secondary | ICD-10-CM | POA: Insufficient documentation

## 2015-06-08 ENCOUNTER — Encounter: Payer: Self-pay | Admitting: Physician Assistant

## 2015-06-19 ENCOUNTER — Encounter: Payer: Self-pay | Admitting: *Deleted

## 2015-06-22 ENCOUNTER — Ambulatory Visit (INDEPENDENT_AMBULATORY_CARE_PROVIDER_SITE_OTHER): Payer: Medicare Other | Admitting: Physician Assistant

## 2015-06-22 ENCOUNTER — Other Ambulatory Visit: Payer: Self-pay

## 2015-06-22 ENCOUNTER — Encounter: Payer: Self-pay | Admitting: Physician Assistant

## 2015-06-22 VITALS — BP 128/66 | HR 84 | Temp 98.1°F | Resp 14 | Ht 66.0 in | Wt 201.0 lb

## 2015-06-22 DIAGNOSIS — E785 Hyperlipidemia, unspecified: Secondary | ICD-10-CM | POA: Diagnosis not present

## 2015-06-22 DIAGNOSIS — H7190 Unspecified cholesteatoma, unspecified ear: Secondary | ICD-10-CM | POA: Diagnosis not present

## 2015-06-22 DIAGNOSIS — R6889 Other general symptoms and signs: Secondary | ICD-10-CM | POA: Diagnosis not present

## 2015-06-22 DIAGNOSIS — H6993 Unspecified Eustachian tube disorder, bilateral: Secondary | ICD-10-CM

## 2015-06-22 DIAGNOSIS — I10 Essential (primary) hypertension: Secondary | ICD-10-CM

## 2015-06-22 DIAGNOSIS — Z0001 Encounter for general adult medical examination with abnormal findings: Secondary | ICD-10-CM

## 2015-06-22 DIAGNOSIS — H6983 Other specified disorders of Eustachian tube, bilateral: Secondary | ICD-10-CM

## 2015-06-22 DIAGNOSIS — M069 Rheumatoid arthritis, unspecified: Secondary | ICD-10-CM | POA: Diagnosis not present

## 2015-06-22 DIAGNOSIS — H906 Mixed conductive and sensorineural hearing loss, bilateral: Secondary | ICD-10-CM | POA: Diagnosis not present

## 2015-06-22 DIAGNOSIS — N182 Chronic kidney disease, stage 2 (mild): Secondary | ICD-10-CM | POA: Diagnosis not present

## 2015-06-22 DIAGNOSIS — E039 Hypothyroidism, unspecified: Secondary | ICD-10-CM

## 2015-06-22 DIAGNOSIS — Z23 Encounter for immunization: Secondary | ICD-10-CM | POA: Diagnosis not present

## 2015-06-22 DIAGNOSIS — R7309 Other abnormal glucose: Secondary | ICD-10-CM

## 2015-06-22 DIAGNOSIS — Z79899 Other long term (current) drug therapy: Secondary | ICD-10-CM | POA: Diagnosis not present

## 2015-06-22 DIAGNOSIS — E559 Vitamin D deficiency, unspecified: Secondary | ICD-10-CM

## 2015-06-22 LAB — HEPATIC FUNCTION PANEL
ALK PHOS: 77 U/L (ref 33–130)
ALT: 23 U/L (ref 6–29)
AST: 18 U/L (ref 10–35)
Albumin: 4 g/dL (ref 3.6–5.1)
BILIRUBIN INDIRECT: 0.2 mg/dL (ref 0.2–1.2)
BILIRUBIN TOTAL: 0.3 mg/dL (ref 0.2–1.2)
Bilirubin, Direct: 0.1 mg/dL (ref <=0.2)
Total Protein: 6.7 g/dL (ref 6.1–8.1)

## 2015-06-22 LAB — MAGNESIUM: Magnesium: 1.9 mg/dL (ref 1.5–2.5)

## 2015-06-22 LAB — CBC WITH DIFFERENTIAL/PLATELET
BASOS ABS: 0.1 10*3/uL (ref 0.0–0.1)
Basophils Relative: 1 % (ref 0–1)
EOS PCT: 5 % (ref 0–5)
Eosinophils Absolute: 0.5 10*3/uL (ref 0.0–0.7)
HEMATOCRIT: 40.2 % (ref 36.0–46.0)
Hemoglobin: 13.4 g/dL (ref 12.0–15.0)
LYMPHS PCT: 38 % (ref 12–46)
Lymphs Abs: 3.5 10*3/uL (ref 0.7–4.0)
MCH: 30.9 pg (ref 26.0–34.0)
MCHC: 33.3 g/dL (ref 30.0–36.0)
MCV: 92.8 fL (ref 78.0–100.0)
MPV: 9.9 fL (ref 8.6–12.4)
Monocytes Absolute: 0.8 10*3/uL (ref 0.1–1.0)
Monocytes Relative: 9 % (ref 3–12)
NEUTROS PCT: 47 % (ref 43–77)
Neutro Abs: 4.3 10*3/uL (ref 1.7–7.7)
PLATELETS: 286 10*3/uL (ref 150–400)
RBC: 4.33 MIL/uL (ref 3.87–5.11)
RDW: 14.5 % (ref 11.5–15.5)
WBC: 9.2 10*3/uL (ref 4.0–10.5)

## 2015-06-22 LAB — BASIC METABOLIC PANEL WITH GFR
BUN: 16 mg/dL (ref 7–25)
CO2: 34 mmol/L — ABNORMAL HIGH (ref 20–31)
Calcium: 9.5 mg/dL (ref 8.6–10.4)
Chloride: 97 mmol/L — ABNORMAL LOW (ref 98–110)
Creat: 0.75 mg/dL (ref 0.60–0.93)
GFR, EST NON AFRICAN AMERICAN: 78 mL/min (ref >=60)
GLUCOSE: 78 mg/dL (ref 65–99)
Potassium: 4.1 mmol/L (ref 3.5–5.3)
Sodium: 136 mmol/L (ref 135–146)

## 2015-06-22 LAB — TSH: TSH: 2.34 u[IU]/mL (ref 0.350–4.500)

## 2015-06-22 LAB — LIPID PANEL
CHOL/HDL RATIO: 3.7 ratio (ref <=5.0)
Cholesterol: 168 mg/dL (ref 125–200)
HDL: 46 mg/dL (ref >=46)
LDL CALC: 91 mg/dL (ref <130)
TRIGLYCERIDES: 154 mg/dL — AB (ref <150)
VLDL: 31 mg/dL — AB (ref <30)

## 2015-06-22 NOTE — Patient Instructions (Signed)
  Bad carbs also include fruit juice, alcohol, and sweet tea. These are empty calories that do not signal to your brain that you are full.   Please remember the good carbs are still carbs which convert into sugar. So please measure them out no more than 1/2-1 cup of rice, oatmeal, pasta, and beans  Veggies are however free foods! Pile them on.   Not all fruit is created equal. Please see the list below, the fruit at the bottom is higher in sugars than the fruit at the top. Please avoid all dried fruits.     We want weight loss that will last so you should lose 1-2 pounds a week.  THAT IS IT! Please pick THREE things a month to change. Once it is a habit check off the item. Then pick another three items off the list to become habits.  If you are already doing a habit on the list GREAT!  Cross that item off! o Don't drink your calories. Ie, alcohol, soda, fruit juice, and sweet tea.  o Drink more water. Drink a glass when you feel hungry or before each meal.  o Eat breakfast - Complex carb and protein (likeDannon light and fit yogurt, oatmeal, fruit, eggs, turkey bacon). o Measure your cereal.  Eat no more than one cup a day. (ie Kashi) o Eat an apple a day. o Add a vegetable a day. o Try a new vegetable a month. o Use Pam! Stop using oil or butter to cook. o Don't finish your plate or use smaller plates. o Share your dessert. o Eat sugar free Jello for dessert or frozen grapes. o Don't eat 2-3 hours before bed. o Switch to whole wheat bread, pasta, and brown rice. o Make healthier choices when you eat out. No fries! o Pick baked chicken, NOT fried. o Don't forget to SLOW DOWN when you eat. It is not going anywhere.  o Take the stairs. o Park far away in the parking lot o Lift soup cans (or weights) for 10 minutes while watching TV. o Walk at work for 10 minutes during break. o Walk outside 1 time a week with your friend, kids, dog, or significant other. o Start a walking group at  church. o Walk the mall as much as you can tolerate.  o Keep a food diary. o Weigh yourself daily. o Walk for 15 minutes 3 days per week. o Cook at home more often and eat out less.  If life happens and you go back to old habits, it is okay.  Just start over. You can do it!   If you experience chest pain, get short of breath, or tired during the exercise, please stop immediately and inform your doctor.   Before you even begin to attack a weight-loss plan, it pays to remember this: You are not fat. You have fat. Losing weight isn't about blame or shame; it's simply another achievement to accomplish. Dieting is like any other skill-you have to buckle down and work at it. As long as you act in a smart, reasonable way, you'll ultimately get where you want to be. Here are some weight loss pearls for you.  1. It's Not a Diet. It's a Lifestyle Thinking of a diet as something you're on and suffering through only for the short term doesn't work. To shed weight and keep it off, you need to make permanent changes to the way you eat. It's OK to indulge occasionally, of course, but if   you cut calories temporarily and then revert to your old way of eating, you'll gain back the weight quicker than you can say yo-yo. Use it to lose it. Research shows that one of the best predictors of long-term weight loss is how many pounds you drop in the first month. For that reason, nutritionists often suggest being stricter for the first two weeks of your new eating strategy to build momentum. Cut out added sugar and alcohol and avoid unrefined carbs. After that, figure out how you can reincorporate them in a way that's healthy and maintainable.  2. There's a Right Way to Exercise Working out burns calories and fat and boosts your metabolism by building muscle. But those trying to lose weight are notorious for overestimating the number of calories they burn and underestimating the amount they take in. Unfortunately, your system  is biologically programmed to hold on to extra pounds and that means when you start exercising, your body senses the deficit and ramps up its hunger signals. If you're not diligent, you'll eat everything you burn and then some. Use it to lose it. Cardio gets all the exercise glory, but strength and interval training are the real heroes. They help you build lean muscle, which in turn increases your metabolism and calorie-burning ability 3. Don't Overreact to Mild Hunger Some people have a hard time losing weight because of hunger anxiety. To them, being hungry is bad-something to be avoided at all costs-so they carry snacks with them and eat when they don't need to. Others eat because they're stressed out or bored. While you never want to get to the point of being ravenous (that's when bingeing is likely to happen), a hunger pang, a craving, or the fact that it's 3:00 p.m. should not send you racing for the vending machine or obsessing about the energy bar in your purse. Ideally, you should put off eating until your stomach is growling and it's difficult to concentrate.  Use it to lose it. When you feel the urge to eat, use the HALT method. Ask yourself, Am I really hungry? Or am I angry or anxious, lonely or bored, or tired? If you're still not certain, try the apple test. If you're truly hungry, an apple should seem delicious; if it doesn't, something else is going on. Or you can try drinking water and making yourself busy, if you are still hungry try a healthy snack.  4. Not All Calories Are Created Equal The mechanics of weight loss are pretty simple: Take in fewer calories than you use for energy. But the kind of food you eat makes all the difference. Processed food that's high in saturated fat and refined starch or sugar can cause inflammation that disrupts the hormone signals that tell your brain you're full. The result: You eat a lot more.  Use it to lose it. Clean up your diet. Swap in whole,  unprocessed foods, including vegetables, lean protein, and healthy fats that will fill you up and give you the biggest nutritional bang for your calorie buck. In a few weeks, as your brain starts receiving regular hunger and fullness signals once again, you'll notice that you feel less hungry overall and naturally start cutting back on the amount you eat.  5. Protein, Produce, and Plant-Based Fats Are Your Weight-Loss Trinity Here's why eating the three Ps regularly will help you drop pounds. Protein fills you up. You need it to build lean muscle, which keeps your metabolism humming so that you can torch   more fat. People in a weight-loss program who ate double the recommended daily allowance for protein (about 110 grams for a 150-pound woman) lost 70 percent of their weight from fat, while people who ate the RDA lost only about 40 percent, one study found. Produce is packed with filling fiber. "It's very difficult to consume too many calories if you're eating a lot of vegetables. Example: Three cups of broccoli is a lot of food, yet only 93 calories. (Fruit is another story. It can be easy to overeat and can contain a lot of calories from sugar, so be sure to monitor your intake.) Plant-based fats like olive oil and those in avocados and nuts are healthy and extra satiating.  Use it to lose it. Aim to incorporate each of the three Ps into every meal and snack. People who eat protein throughout the day are able to keep weight off, according to a study in the American Journal of Clinical Nutrition. In addition to meat, poultry and seafood, good sources are beans, lentils, eggs, tofu, and yogurt. As for fat, keep portion sizes in check by measuring out salad dressing, oil, and nut butters (shoot for one to two tablespoons). Finally, eat veggies or a little fruit at every meal. People who did that consumed 308 fewer calories but didn't feel any hungrier than when they didn't eat more produce.  7. How You Eat Is  As Important As What You Eat In order for your brain to register that you're full, you need to focus on what you're eating. Sit down whenever you eat, preferably at a table. Turn off the TV or computer, put down your phone, and look at your food. Smell it. Chew slowly, and don't put another bite on your fork until you swallow. When women ate lunch this attentively, they consumed 30 percent less when snacking later than those who listened to an audiobook at lunchtime, according to a study in the [employer] Journal of Nutrition. 8. Weighing Yourself Really Works The scale provides the best evidence about whether your efforts are paying off. Seeing the numbers tick up or down or stagnate is motivation to keep going-or to rethink your approach. A 2015 study at [employer] found that daily weigh-ins helped people lose more weight, keep it off, and maintain that loss, even after two years. Use it to lose it. Step on the scale at the same time every day for the best results. If your weight shoots up several pounds from one weigh-in to the next, don't freak out. Eating a lot of salt the night before or having your period is the likely culprit. The number should return to normal in a day or two. It's a steady climb that you need to do something about. 9. Too Much Stress and Too Little Sleep Are Your Enemies When you're tired and frazzled, your body cranks up the production of cortisol, the stress hormone that can cause carb cravings. Not getting enough sleep also boosts your levels of ghrelin, a hormone associated with hunger, while suppressing leptin, a hormone that signals fullness and satiety. People on a diet who slept only five and a half hours a night for two weeks lost 55 percent less fat and were hungrier than those who slept eight and a half hours, according to a study in the [employer] Journal. Use it to lose it. Prioritize sleep, aiming for seven hours or more a night, which research  shows helps lower stress. And make sure you're getting   quality zzz's. If a snoring spouse or a fidgety cat wakes you up frequently throughout the night, you may end up getting the equivalent of just four hours of sleep, according to a study from [employer]. Keep pets out of the bedroom, and use a white-noise app to drown out snoring. 10. You Will Hit a plateau-And You Can Bust Through It As you slim down, your body releases much less leptin, the fullness hormone.  If you're not strength training, start right now. Building muscle can raise your metabolism to help you overcome a plateau. To keep your body challenged and burning calories, incorporate new moves and more intense intervals into your workouts or add another sweat session to your weekly routine. Alternatively, cut an extra 100 calories or so a day from your diet. Now that you've lost weight, your body simply doesn't need as much fuel.   

## 2015-06-22 NOTE — Progress Notes (Signed)
Complete Physical  Assessment and Plan: 1. Essential hypertension - continue medications, DASH diet, exercise and monitor at home. Call if greater than 130/80.  - CBC with Differential - Hepatic function panel - Urinalysis, Routine w reflex microscopic - Microalbumin / creatinine urine ratio - EKG 12-Lead  2. Hypothyroidism, unspecified hypothyroidism type Hypothyroidism-check TSH level, continue medications the same, reminded to take on an empty stomach 30-1mins before food.  - TSH  3. Hyperlipidemia -continue medications, check lipids, decrease fatty foods, increase activity.  - Lipid panel  4. Prediabetes Discussed general issues about diabetes pathophysiology and management., Educational material distributed., Suggested low cholesterol diet., Encouraged aerobic exercise., Discussed foot care., Reminded to get yearly retinal exam. - Hemoglobin A1c - Insulin, fasting  5. CKD (chronic kidney disease) stage 2, GFR 60-89 ml/min Increase fluids, avoid NSAIDS, monitor sugars, will monitor - BASIC METABOLIC PANEL WITH GFR  6. Rheumatoid arthritis Send LFTs RA doctor  7. Vitamin D deficiency - Vit D  25 hydroxy (rtn osteoporosis monitoring)  8. Medication management - Magnesium  9. Allergy, sequela - montelukast (SINGULAIR) 10 MG tablet; Take 1 tablet (10 mg total) by mouth daily.  Dispense: 30 tablet; Refill: 2   Discussed med's effects and SE's. Screening labs and tests as requested with regular follow-up as recommended.  Future Appointments Date Time Provider Department Center  06/22/2016 3:00 PM Quentin Mulling, PA-C [id number] None     HPI  75 y.o. female  presents for a complete physical.   Her blood pressure has been controlled at home, today their BP is BP: 128/66 mmHg She does not workout but does yard work She denies chest pain, shortness of breath, dizziness.  She is not on cholesterol medication and denies myalgias. Her cholesterol is at goal. The  cholesterol last visit was:   Lab Results  Component Value Date   CHOL 151 12/30/2014   HDL 46 12/30/2014   LDLCALC 83 12/30/2014   TRIG 108 12/30/2014   CHOLHDL 3.3 12/30/2014   She has been working on diet and exercise for prediabetes, and denies paresthesia of the feet, polydipsia, polyuria and visual disturbances. Last A1C in the office was:  Lab Results  Component Value Date   HGBA1C 6.1* 12/30/2014  Patient is on Vitamin D supplement.   Lab Results  Component Value Date   VD25OH 49 12/30/2014   She has RA, follows with Dr. Emmit Pomfret.  She is back on celexa, which helps with mood.  She is on thyroid medication. Her medication was changed last visit, it was decreased to one pill daily.  Lab Results  Component Value Date   TSH 1.178 12/30/2014  She was a former smoker, has COPD. She has advair and last visit added singulair which has helped, does not use albuterol. She denies orthopnea, PND, edema. She is on methotrexate, 8 on Friday. Last CXR was 05/2014, has never had PFTS.  Wt Readings from Last 3 Encounters:  06/22/15 201 lb (91.173 kg)  12/30/14 197 lb 9.6 oz (89.631 kg)  09/09/14 196 lb (88.905 kg)    Current Medications:  Current Outpatient Prescriptions on File Prior to Visit  Medication Sig Dispense Refill  . calcium carbonate (OS-CAL) 600 MG TABS tablet Take 600 mg by mouth 2 (two) times daily with a meal.    . citalopram (CELEXA) 40 MG tablet take 1 tablet by mouth once daily 90 tablet 1  . Fluticasone-Salmeterol (ADVAIR DISKUS) 100-50 MCG/DOSE AEPB Inhale 1 dose by mouth twice a day. 60 each 1  .  folic acid (FOLVITE) 1 MG tablet Take by mouth daily.    Marland Kitchen glucose blood (FREESTYLE LITE) test strip Test Blood Sugar one time daily 100 each prn  . hydrochlorothiazide (HYDRODIURIL) 25 MG tablet take 1 tablet by mouth once daily for blood pressure and fluid retention 90 tablet 1  . levothyroxine (SYNTHROID, LEVOTHROID) 112 MCG tablet take 1 tablet by mouth once daily before  BREAKFAST 90 tablet 3  . lidocaine (XYLOCAINE) 2 % jelly apply affected area if needed as directed by prescriber 30 mL 1  . losartan (COZAAR) 100 MG tablet take 1 tablet by mouth once daily 90 tablet 2  . Magnesium 400 MG CAPS Take 400 mg by mouth 2 (two) times daily.    . montelukast (SINGULAIR) 10 MG tablet Take 1 tablet (10 mg total) by mouth daily. 90 tablet 2  . Multiple Vitamins-Minerals (MULTIVITAMIN PO) Take by mouth daily.    . Omega-3 Fatty Acids (FISH OIL PO) Take by mouth 2 (two) times daily.      No current facility-administered medications on file prior to visit.   Health Maintenance:   Immunization History  Administered Date(s) Administered  . Influenza, High Dose Seasonal PF 02/03/2014, 02/14/2015  . Pneumococcal Conjugate-13 07/16/2014  . Pneumococcal Polysaccharide-23 05/30/2003  . Td 05/29/2005  . Zoster 05/29/2006   Tetanus: 2007 DUE today Pneumovax: 2005 Prevnar: 2016 Flu vaccine: 02/03/2014 Zostavax: 2008   Pap: 2012 neg  MGM:  02/2015 DEXA: 2013 nl Declines Colonoscopy: 2010 + tics due 2020  EGD: N/A Echo 01/2010 45% to 50% with mild MR Last Dental Exam: Dr. Weston Anna Last Eye Exam: Dr. August Luz  Patient Care Team: Lucky Cowboy, MD as PCP - General (Internal Medicine) Lurena Nida, MD as Consulting Physician (Rheumatology) Shea Evans, MD as Consulting Physician (Obstetrics and Gynecology) Bernette Redbird, MD as Consulting Physician (Gastroenterology) Ronney Asters May, MD as Referring Physician (Otolaryngology) Berneice Gandy, MD as Referring Physician (Dermatology)  Allergies:  Allergies  Allergen Reactions  . Pneumococcal Vaccines   . Ace Inhibitors Cough    Other reaction(s): Other (See Comments) Made her cough  . Pneumococcal Vaccine     Other reaction(s): Other (See Comments) Caused arm to swell   Medical History:  Past Medical History  Diagnosis Date  . Hyperlipidemia   . Hypertension   . Prediabetes   . Thyroid  disease   . Vitamin D deficiency   . Allergy   . Rheumatoid arthritis [facility])    Surgical History:  Past Surgical History  Procedure Laterality Date  . Cochlear implant Right 2007  . Appendectomy    . Tubal ligation Bilateral   . Cholecystectomy    . Other surgical history      4 ear surgeries  . Ankle arthroscopy with open reduction internal fixation (orif) Right 2004  . Other surgical history      Right temple bone surgery   Family History:  Family History  Problem Relation Age of Onset  . Heart attack Mother   . Hypertension Mother   . Lymphoma Mother    Social History:  Social History  Substance Use Topics  . Smoking status: Former Smoker    Quit date: 09/08/1988  . Smokeless tobacco: Never Used  . Alcohol Use: No    Review of Systems: Review of Systems  Constitutional: Negative.   HENT: Positive for hearing loss. Negative for congestion, ear discharge, ear pain, nosebleeds, sore throat and tinnitus.   Eyes: Negative.   Respiratory: Negative for cough, hemoptysis,  sputum production, shortness of breath, wheezing and stridor.   Cardiovascular: Negative.  Negative for chest pain, palpitations and leg swelling.  Gastrointestinal: Positive for heartburn (has been trying to get off prilosec). Negative for nausea, vomiting, abdominal pain, diarrhea, constipation, blood in stool and melena.  Genitourinary: Negative.   Musculoskeletal: Positive for myalgias. Negative for back pain, joint pain, falls and neck pain.  Skin: Negative.   Neurological: Negative.  Negative for headaches.  Psychiatric/Behavioral: Negative for depression, suicidal ideas, hallucinations, memory loss and substance abuse. The patient is not nervous/anxious and does not have insomnia.      Physical Exam: Estimated body mass index is 32.46 kg/(m^2) as calculated from the following:   Height as of this encounter: 5\' 6"  (1.676 m).   Weight as of this encounter: 201 lb (91.173 kg). BP 128/66 mmHg   Pulse 84  Temp(Src) 98.1 F (36.7 C) (Temporal)  Resp 14  Ht 5\' 6"  (1.676 m)  Wt 201 lb (91.173 kg)  BMI 32.46 kg/m2  SpO2 96% General Appearance: Well nourished, in no apparent distress.  Eyes: PERRLA, EOMs, conjunctiva no swelling or erythema, normal fundi and vessels.  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 Tonsils not swollen or erythematous. Hearing impaired, has hearing aids Neck: Supple, thyroid normal. No bruits  Respiratory: Respiratory effort normal,  without wheezing, rales, rhonchi,or stridor.  Cardio: RRR without murmurs, rubs or gallops. Brisk peripheral pulses without edema.  Chest: symmetric, with normal excursions and percussion.  Breasts: defer Abdomen: Soft, + epigastric tender, no guarding, rebound, hernias, masses, or organomegaly. .  Lymphatics: Non tender without lymphadenopathy.  Genitourinary: defer Musculoskeletal: Full ROM all peripheral extremities,5/5 strength, and normal gait.  Skin: several seb derm keratosis, skin tag left axilla. Warm, dry without rashes, lesions, ecchymosis. Neuro: Cranial nerves intact, reflexes equal bilaterally. Normal muscle tone, no cerebellar symptoms. Sensation intact.  Psych: Awake and oriented X 3, normal affect, Insight and Judgment appropriate.   EKG: WNL,  no ST changes. AORTA SCAN: defer   Quentin Mulling 2:59 PM [facility]

## 2015-06-23 LAB — URINALYSIS, ROUTINE W REFLEX MICROSCOPIC
Bilirubin Urine: NEGATIVE
GLUCOSE, UA: NEGATIVE
Hgb urine dipstick: NEGATIVE
Ketones, ur: NEGATIVE
LEUKOCYTES UA: NEGATIVE
Nitrite: NEGATIVE
PH: 6.5 (ref 5.0–8.0)
Protein, ur: NEGATIVE
Specific Gravity, Urine: 1.017 (ref 1.001–1.035)

## 2015-06-23 LAB — HEMOGLOBIN A1C
Hgb A1c MFr Bld: 6.1 % — ABNORMAL HIGH (ref <5.7)
Mean Plasma Glucose: 128 mg/dL — ABNORMAL HIGH (ref <117)

## 2015-06-23 LAB — VITAMIN D 25 HYDROXY (VIT D DEFICIENCY, FRACTURES): Vit D, 25-Hydroxy: 61 ng/mL (ref 30–100)

## 2015-06-23 LAB — MICROALBUMIN / CREATININE URINE RATIO
Creatinine, Urine: 107 mg/dL (ref 20–320)
MICROALB/CREAT RATIO: 5 ug/mg{creat} (ref <30)
Microalb, Ur: 0.5 mg/dL

## 2015-06-24 ENCOUNTER — Other Ambulatory Visit: Payer: Self-pay | Admitting: *Deleted

## 2015-06-24 DIAGNOSIS — T7840XS Allergy, unspecified, sequela: Secondary | ICD-10-CM

## 2015-06-24 DIAGNOSIS — R059 Cough, unspecified: Secondary | ICD-10-CM

## 2015-06-24 DIAGNOSIS — R05 Cough: Secondary | ICD-10-CM

## 2015-06-24 MED ORDER — MONTELUKAST SODIUM 10 MG PO TABS: 10.0000 mg | ORAL_TABLET | Freq: Every day | ORAL | Status: DC

## 2015-09-08 ENCOUNTER — Other Ambulatory Visit: Payer: Self-pay | Admitting: Internal Medicine

## 2015-09-30 ENCOUNTER — Ambulatory Visit (INDEPENDENT_AMBULATORY_CARE_PROVIDER_SITE_OTHER): Payer: Medicare Other | Admitting: Internal Medicine

## 2015-09-30 ENCOUNTER — Encounter: Payer: Self-pay | Admitting: Internal Medicine

## 2015-09-30 VITALS — BP 126/70 | HR 72 | Temp 98.2°F | Resp 16 | Ht 66.0 in | Wt 200.0 lb

## 2015-09-30 DIAGNOSIS — R7303 Prediabetes: Secondary | ICD-10-CM

## 2015-09-30 DIAGNOSIS — E785 Hyperlipidemia, unspecified: Secondary | ICD-10-CM

## 2015-09-30 DIAGNOSIS — Z23 Encounter for immunization: Secondary | ICD-10-CM | POA: Diagnosis not present

## 2015-09-30 DIAGNOSIS — I10 Essential (primary) hypertension: Secondary | ICD-10-CM | POA: Diagnosis not present

## 2015-09-30 DIAGNOSIS — Z79899 Other long term (current) drug therapy: Secondary | ICD-10-CM

## 2015-09-30 DIAGNOSIS — Z887 Allergy status to serum and vaccine status: Secondary | ICD-10-CM

## 2015-09-30 LAB — LIPID PANEL
CHOLESTEROL: 159 mg/dL (ref 125–200)
HDL: 50 mg/dL (ref >=46)
LDL Cholesterol: 88 mg/dL (ref <130)
Total CHOL/HDL Ratio: 3.2 Ratio (ref <=5.0)
Triglycerides: 106 mg/dL (ref <150)
VLDL: 21 mg/dL (ref <30)

## 2015-09-30 LAB — CBC WITH DIFFERENTIAL/PLATELET
BASOS ABS: 65 {cells}/uL (ref 0–200)
Basophils Relative: 1 %
EOS ABS: 390 {cells}/uL (ref 15–500)
Eosinophils Relative: 6 %
HEMATOCRIT: 39.8 % (ref 35.0–45.0)
HEMOGLOBIN: 13.4 g/dL (ref 11.7–15.5)
LYMPHS ABS: 2665 {cells}/uL (ref 850–3900)
Lymphocytes Relative: 41 %
MCH: 31.5 pg (ref 27.0–33.0)
MCHC: 33.7 g/dL (ref 32.0–36.0)
MCV: 93.6 fL (ref 80.0–100.0)
MPV: 10 fL (ref 7.5–12.5)
Monocytes Absolute: 520 cells/uL (ref 200–950)
Monocytes Relative: 8 %
NEUTROS ABS: 2860 {cells}/uL (ref 1500–7800)
NEUTROS PCT: 44 %
Platelets: 298 10*3/uL (ref 140–400)
RBC: 4.25 MIL/uL (ref 3.80–5.10)
RDW: 15.2 % — ABNORMAL HIGH (ref 11.0–15.0)
WBC: 6.5 10*3/uL (ref 3.8–10.8)

## 2015-09-30 LAB — HEPATIC FUNCTION PANEL
ALT: 27 U/L (ref 6–29)
AST: 22 U/L (ref 10–35)
Albumin: 4 g/dL (ref 3.6–5.1)
Alkaline Phosphatase: 73 U/L (ref 33–130)
BILIRUBIN DIRECT: 0.1 mg/dL (ref <=0.2)
Indirect Bilirubin: 0.4 mg/dL (ref 0.2–1.2)
TOTAL PROTEIN: 6.7 g/dL (ref 6.1–8.1)
Total Bilirubin: 0.5 mg/dL (ref 0.2–1.2)

## 2015-09-30 LAB — BASIC METABOLIC PANEL WITH GFR
BUN: 14 mg/dL (ref 7–25)
CALCIUM: 9.6 mg/dL (ref 8.6–10.4)
CHLORIDE: 99 mmol/L (ref 98–110)
CO2: 30 mmol/L (ref 20–31)
CREATININE: 0.73 mg/dL (ref 0.60–0.93)
GFR, Est African American: >89 mL/min (ref >=60)
GFR, Est Non African American: 80 mL/min (ref >=60)
GLUCOSE: 102 mg/dL — AB (ref 65–99)
Potassium: 3.9 mmol/L (ref 3.5–5.3)
Sodium: 137 mmol/L (ref 135–146)

## 2015-09-30 LAB — HEMOGLOBIN A1C
HEMOGLOBIN A1C: 6.1 % — AB (ref <5.7)
MEAN PLASMA GLUCOSE: 128 mg/dL

## 2015-09-30 LAB — TSH: TSH: 2.02 m[IU]/L

## 2015-09-30 MED ORDER — DEXAMETHASONE SODIUM PHOSPHATE 100 MG/10ML IJ SOLN
10.0000 mg | Freq: Once | INTRAMUSCULAR | Status: AC
  Administered 2015-09-30: 10 mg via INTRAMUSCULAR

## 2015-09-30 MED ORDER — PREDNISOLONE 15 MG/5ML PO SOLN: ORAL | Status: DC

## 2015-09-30 NOTE — Progress Notes (Signed)
Assessment and Plan:  Hypertension:  -Continue medication,  -monitor blood pressure at home.  -Continue DASH diet.   -Reminder to go to the ER if any CP, SOB, nausea, dizziness, severe HA, changes vision/speech, left arm numbness and tingling, and jaw pain.  Cholesterol: -Continue diet and exercise.  -Check cholesterol.   Pre-diabetes: -Continue diet and exercise.  -Check A1C  Vitamin D Def: -check level -continue medications.   Scalp itching -change to baby shampoo -prednisolone solution  Continue diet and meds as discussed. Further disposition pending results of labs.  HPI 75 y.o. female  presents for 3 month follow up with hypertension, hyperlipidemia, prediabetes and vitamin D.   Her blood pressure has been controlled at home, today their BP is BP: 126/70 mmHg.   She does not workout. She denies chest pain, shortness of breath, dizziness.  She reports that she is walking out in the yard.     She is on cholesterol medication and denies myalgias. Her cholesterol is at goal. The cholesterol last visit was:   Lab Results  Component Value Date   CHOL 168 06/22/2015   HDL 46 06/22/2015   LDLCALC 91 06/22/2015   TRIG 154* 06/22/2015   CHOLHDL 3.7 06/22/2015     She has been working on diet and exercise for prediabetes, and denies foot ulcerations, hyperglycemia, hypoglycemia , increased appetite, nausea, paresthesia of the feet, polydipsia, polyuria, visual disturbances, vomiting and weight loss. Last A1C in the office was:  Lab Results  Component Value Date   HGBA1C 6.1* 06/22/2015    Patient is on Vitamin D supplement.  Lab Results  Component Value Date   VD25OH 76 06/22/2015     She reports that her scalp has been itching a lot.  She reports that she did see the dermatologist and he told her to use dandruff shampoo.  She would like to see if there is anything that helps.    She does take her thyroid medication at night time.     Current Medications:  Current  Outpatient Prescriptions on File Prior to Visit  Medication Sig Dispense Refill  . Adalimumab (HUMIRA PEN) 40 MG/0.8ML PNKT     . aspirin EC 81 MG tablet Take 81 mg by mouth.    . calcium carbonate (OS-CAL) 600 MG TABS tablet Take 600 mg by mouth 2 (two) times daily with a meal.    . Cholecalciferol (VITAMIN D3) 2000 units capsule Take by mouth.    . citalopram (CELEXA) 40 MG tablet take 1 tablet by mouth once daily 90 tablet 1  . Fluticasone-Salmeterol (ADVAIR DISKUS) 100-50 MCG/DOSE AEPB Inhale 1 dose by mouth twice a day. 60 each 1  . folic acid (FOLVITE) 1 MG tablet Take by mouth daily.    Marland Kitchen glucose blood (FREESTYLE LITE) test strip Test Blood Sugar one time daily 100 each prn  . hydrochlorothiazide (HYDRODIURIL) 25 MG tablet take 1 tablet by mouth once daily for blood pressure and FLUID RETENTION 90 tablet 1  . levothyroxine (SYNTHROID, LEVOTHROID) 112 MCG tablet take 1 tablet by mouth once daily before BREAKFAST 90 tablet 3  . lidocaine (XYLOCAINE) 2 % jelly apply affected area if needed as directed by prescriber 30 mL 1  . losartan (COZAAR) 100 MG tablet take 1 tablet by mouth once daily 90 tablet 2  . Magnesium 400 MG CAPS Take 400 mg by mouth 2 (two) times daily.    . methotrexate (RHEUMATREX) 2.5 MG tablet Take by mouth. Takes 8 pills on Fridays    .  montelukast (SINGULAIR) 10 MG tablet Take 1 tablet (10 mg total) by mouth daily. 90 tablet 2  . Multiple Vitamins-Minerals (MULTIVITAMIN PO) Take by mouth daily.    . Omega-3 Fatty Acids (FISH OIL PO) Take by mouth 2 (two) times daily.      No current facility-administered medications on file prior to visit.    Medical History:  Past Medical History  Diagnosis Date  . Hyperlipidemia   . Hypertension   . Prediabetes   . Thyroid disease   . Vitamin D deficiency   . Allergy   . Rheumatoid arthritis (HCC)     Allergies:  Allergies  Allergen Reactions  . Ace Inhibitors Cough    Other reaction(s): Other (See Comments) Made her  cough  . Pneumococcal Vaccine     Other reaction(s): Other (See Comments) Caused arm to swell     Review of Systems:  Review of Systems  Constitutional: Negative for fever, chills and malaise/fatigue.  HENT: Negative for congestion, ear pain and sore throat.   Respiratory: Negative for cough, shortness of breath and wheezing.   Cardiovascular: Negative for chest pain, palpitations and leg swelling.  Gastrointestinal: Negative for heartburn, abdominal pain, diarrhea, constipation, blood in stool and melena.  Genitourinary: Negative.   Skin: Positive for itching (scalp itching).  Neurological: Negative for dizziness, sensory change, loss of consciousness and headaches.  Psychiatric/Behavioral: Negative for depression. The patient is not nervous/anxious and does not have insomnia.     Family history- Review and unchanged  Social history- Review and unchanged  Physical Exam: BP 126/70 mmHg  Pulse 72  Temp(Src) 98.2 F (36.8 C) (Temporal)  Resp 16  Ht 5\' 6"  (1.676 m)  Wt 200 lb (90.719 kg)  BMI 32.30 kg/m2 Wt Readings from Last 3 Encounters:  09/30/15 200 lb (90.719 kg)  06/22/15 201 lb (91.173 kg)  12/30/14 197 lb 9.6 oz (89.631 kg)    General Appearance: Well nourished well developed, in no apparent distress. Eyes: PERRLA, EOMs, conjunctiva no swelling or erythema ENT/Mouth: Ear canals normal without obstruction, swelling, erythma, discharge.  TMs normal bilaterally.  Oropharynx moist, clear, without exudate, or postoropharyngeal swelling. Neck: Supple, thyroid normal,no cervical adenopathy  Respiratory: Respiratory effort normal, Breath sounds clear A&P without rhonchi, wheeze, or rale.  No retractions, no accessory usage. Cardio: RRR with no MRGs. Brisk peripheral pulses without edema.  Abdomen: Soft, + BS,  Non tender, no guarding, rebound, hernias, masses. Musculoskeletal: Full ROM, 5/5 strength, Normal gait Skin: Warm, dry without rashes, lesions, ecchymosis.  Generalized 0.5 mm papular rash on the scalp.   Neuro: Awake and oriented X 3, Cranial nerves intact. Normal muscle tone, no cerebellar symptoms. Psych: Normal affect, Insight and Judgment appropriate.    03/01/15, PA-C 9:07 AM [facility]

## 2015-09-30 NOTE — Patient Instructions (Signed)
Please use Yellow [employer] baby shampoo.    You can put the prednisolone solution on the scalp.  A little bit goes a long way.

## 2015-10-12 ENCOUNTER — Other Ambulatory Visit: Payer: Self-pay | Admitting: Internal Medicine

## 2015-10-26 ENCOUNTER — Encounter: Payer: Self-pay | Admitting: Physician Assistant

## 2015-10-26 ENCOUNTER — Ambulatory Visit (INDEPENDENT_AMBULATORY_CARE_PROVIDER_SITE_OTHER): Payer: Medicare Other | Admitting: Physician Assistant

## 2015-10-26 VITALS — BP 130/80 | HR 85 | Temp 97.3°F | Resp 16 | Ht 66.0 in | Wt 197.8 lb

## 2015-10-26 DIAGNOSIS — I10 Essential (primary) hypertension: Secondary | ICD-10-CM

## 2015-10-26 DIAGNOSIS — Z0000 Encounter for general adult medical examination without abnormal findings: Secondary | ICD-10-CM

## 2015-10-26 DIAGNOSIS — J449 Chronic obstructive pulmonary disease, unspecified: Secondary | ICD-10-CM

## 2015-10-26 DIAGNOSIS — E559 Vitamin D deficiency, unspecified: Secondary | ICD-10-CM

## 2015-10-26 DIAGNOSIS — R6889 Other general symptoms and signs: Secondary | ICD-10-CM

## 2015-10-26 DIAGNOSIS — M79671 Pain in right foot: Secondary | ICD-10-CM

## 2015-10-26 DIAGNOSIS — N182 Chronic kidney disease, stage 2 (mild): Secondary | ICD-10-CM | POA: Diagnosis not present

## 2015-10-26 DIAGNOSIS — H906 Mixed conductive and sensorineural hearing loss, bilateral: Secondary | ICD-10-CM | POA: Diagnosis not present

## 2015-10-26 DIAGNOSIS — E039 Hypothyroidism, unspecified: Secondary | ICD-10-CM | POA: Diagnosis not present

## 2015-10-26 DIAGNOSIS — M069 Rheumatoid arthritis, unspecified: Secondary | ICD-10-CM | POA: Diagnosis not present

## 2015-10-26 DIAGNOSIS — E785 Hyperlipidemia, unspecified: Secondary | ICD-10-CM | POA: Diagnosis not present

## 2015-10-26 DIAGNOSIS — Z79899 Other long term (current) drug therapy: Secondary | ICD-10-CM

## 2015-10-26 DIAGNOSIS — F325 Major depressive disorder, single episode, in full remission: Secondary | ICD-10-CM | POA: Diagnosis not present

## 2015-10-26 DIAGNOSIS — Z0001 Encounter for general adult medical examination with abnormal findings: Secondary | ICD-10-CM

## 2015-10-26 DIAGNOSIS — R7309 Other abnormal glucose: Secondary | ICD-10-CM

## 2015-10-26 LAB — CBC WITH DIFFERENTIAL/PLATELET
BASOS PCT: 1 %
Basophils Absolute: 73 cells/uL (ref 0–200)
EOS ABS: 438 {cells}/uL (ref 15–500)
Eosinophils Relative: 6 %
HEMATOCRIT: 40.5 % (ref 35.0–45.0)
HEMOGLOBIN: 13.5 g/dL (ref 11.7–15.5)
LYMPHS ABS: 3431 {cells}/uL (ref 850–3900)
LYMPHS PCT: 47 %
MCH: 31 pg (ref 27.0–33.0)
MCHC: 33.3 g/dL (ref 32.0–36.0)
MCV: 92.9 fL (ref 80.0–100.0)
MONO ABS: 365 {cells}/uL (ref 200–950)
MPV: 10.4 fL (ref 7.5–12.5)
Monocytes Relative: 5 %
Neutro Abs: 2993 cells/uL (ref 1500–7800)
Neutrophils Relative %: 41 %
PLATELETS: 288 10*3/uL (ref 140–400)
RBC: 4.36 MIL/uL (ref 3.80–5.10)
RDW: 14.8 % (ref 11.0–15.0)
WBC: 7.3 10*3/uL (ref 3.8–10.8)

## 2015-10-26 LAB — URIC ACID: Uric Acid, Serum: 5.1 mg/dL (ref 2.4–7.0)

## 2015-10-26 MED ORDER — MELOXICAM 15 MG PO TABS: ORAL_TABLET | ORAL | Status: DC

## 2015-10-26 NOTE — Progress Notes (Signed)
Acute visit and medicare wellness  Assessment and Plan: 1. Essential hypertension - continue medications, DASH diet, exercise and monitor at home. Call if greater than 130/80.   2. Hypothyroidism, unspecified hypothyroidism type Hypothyroidism-check TSH level, continue medications the same, reminded to take on an empty stomach 30-62mins before food.   3. Hyperlipidemia -continue medications, check lipids, decrease fatty foods, increase activity.   4. Prediabetes Discussed general issues about diabetes pathophysiology and management., Educational material distributed., Suggested low cholesterol diet., Encouraged aerobic exercise., Discussed foot care., Reminded to get yearly retinal exam.  5. CKD (chronic kidney disease) stage 2, GFR 60-89 ml/min Increase fluids, avoid NSAIDS, monitor sugars, will monitor  6. Rheumatoid arthritis Send LFTs RA doctor  7. Vitamin D deficiency  8. Medication management  9. 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 Chronic obstructive pulmonary disease, unspecified COPD type (HCC) Continue medications  12. Hearing loss, mixed, bilateral Has hearing aids.   13. Right foot pain/heel pain Likely Plantar Faciitis, will also check for gout, and instructed to follow up with RA doctor if not better-  Conservative treatment, night time orthotics, arch support, RICE, NSAID, stretches given If not better will do injection of dexamethasone    Discussed med's effects and SE's. Screening labs and tests as requested with regular follow-up as recommended.  Future Appointments Date Time Provider Department Center  01/03/2016 8:45 AM Lucky Cowboy, MD [facility] None  06/22/2016 3:00 PM Quentin Mulling, PA-C [facility] None    During the course of the visit the patient was educated and  counseled about appropriate screening and preventive services including:    Pneumococcal vaccine   Influenza vaccine  Td vaccine  Screening electrocardiogram  Bone densitometry screening  Colorectal cancer screening  Diabetes screening  Glaucoma screening  Nutrition counseling   Advanced directives: requested    HPI  75 y.o. female  presents for a acute foot pain and wellness visit. Last wellness visit was 12/2014.   She has history of RA, CKD presents with right foot pain x 2 weeks. Heel pain, pain with stepping down in the AM, and worse at night. No injury. Worse with walking on it, worse at night, throbbing pain. Has tried switching several different shoes. Does have some warmth on her heel, no swelling, redness, discharge, numbness/tingling. Has had history of gout, not on medications.    Her blood pressure has been controlled at home, today their BP is BP: 130/80 mmHg She does not workout but does yard work She denies chest pain, shortness of breath, dizziness.  She is not on cholesterol medication and denies myalgias. Her cholesterol is at goal. The cholesterol last visit was:   Lab Results  Component Value Date   CHOL 159 09/30/2015   HDL 50 09/30/2015   LDLCALC 88 09/30/2015   TRIG 106 09/30/2015   CHOLHDL 3.2 09/30/2015   She has been working on diet and exercise for prediabetes, and denies paresthesia of the feet, polydipsia, polyuria and visual disturbances. Last A1C in the office was:  Lab Results  Component Value Date   HGBA1C 6.1* 09/30/2015  Patient is on Vitamin D supplement.   Lab Results  Component Value Date   VD25OH 61 06/22/2015   She has RA, follows with Dr. Emmit Pomfret.  She is back on celexa, which helps with mood.  She is on thyroid medication. Her medication was  changed last visit, it was decreased to one pill daily.  Lab Results  Component Value Date   TSH 2.02 09/30/2015  She was a former smoker, has COPD. She has advair and last visit added  singulair which has helped, does not use albuterol. She denies orthopnea, PND, edema. She is on methotrexate, 8 on Friday. Last CXR was 05/2014, has never had PFTS.  BMI is Body mass index is 31.94 kg/(m^2)., she is working on diet and exercise. Wt Readings from Last 3 Encounters:  10/26/15 197 lb 12.8 oz (89.721 kg)  09/30/15 200 lb (90.719 kg)  06/22/15 201 lb (91.173 kg)    Current Medications:  Current Outpatient Prescriptions on File Prior to Visit  Medication Sig Dispense Refill  . Adalimumab (HUMIRA PEN) 40 MG/0.8ML PNKT     . aspirin EC 81 MG tablet Take 81 mg by mouth.    . calcium carbonate (OS-CAL) 600 MG TABS tablet Take 600 mg by mouth 2 (two) times daily with a meal.    . Cholecalciferol (VITAMIN D3) 2000 units capsule Take by mouth.    . citalopram (CELEXA) 40 MG tablet take 1 tablet by mouth once daily 90 tablet 1  . Fluticasone-Salmeterol (ADVAIR DISKUS) 100-50 MCG/DOSE AEPB Inhale 1 dose by mouth twice a day. 60 each 1  . folic acid (FOLVITE) 1 MG tablet Take by mouth daily.    Marland Kitchen glucose blood (FREESTYLE LITE) test strip Test Blood Sugar one time daily 100 each prn  . hydrochlorothiazide (HYDRODIURIL) 25 MG tablet take 1 tablet by mouth once daily for blood pressure and FLUID RETENTION 90 tablet 1  . levothyroxine (SYNTHROID, LEVOTHROID) 112 MCG tablet take 1 tablet by mouth once daily before BREAKFAST 90 tablet 3  . lidocaine (XYLOCAINE) 2 % jelly apply affected area if needed as directed by prescriber 30 mL 1  . losartan (COZAAR) 100 MG tablet take 1 tablet by mouth once daily 90 tablet 2  . Magnesium 400 MG CAPS Take 400 mg by mouth 2 (two) times daily.    . methotrexate (RHEUMATREX) 2.5 MG tablet Take by mouth. Takes 8 pills on Fridays    . montelukast (SINGULAIR) 10 MG tablet Take 1 tablet (10 mg total) by mouth daily. 90 tablet 2  . Multiple Vitamins-Minerals (MULTIVITAMIN PO) Take by mouth daily.    . Omega-3 Fatty Acids (FISH OIL PO) Take by mouth 2 (two) times  daily.     . prednisoLONE (PRELONE) 15 MG/5ML SOLN Place a thin layer on the scalp as needed for scalp itching twice daily. 450 mL 2   No current facility-administered medications on file prior to visit.   Health Maintenance:   Immunization History  Administered Date(s) Administered  . DT 06/22/2015  . Influenza, High Dose Seasonal PF 02/03/2014, 02/14/2015  . Pneumococcal Conjugate-13 07/16/2014  . Pneumococcal Polysaccharide-23 05/30/2003, 09/30/2015  . Td 05/29/2005  . Zoster 05/29/2006   Tetanus: 2017 Pneumovax: 2005 Prevnar: 2016 Flu vaccine: 2016 Zostavax: 2008   Pap: 2012 neg  MGM:  02/2015 DEXA: 2013 nl Declines Colonoscopy: 2010 + tics due 2020  EGD: N/A Echo 01/2010 45% to 50% with mild MR Last Dental Exam: Dr. Weston Anna Last Eye Exam: Dr. August Luz, Jan 2017  Patient Care Team: Lucky Cowboy, MD as PCP - General (Internal Medicine) Lurena Nida, MD as Consulting Physician (Rheumatology) Shea Evans, MD as Consulting Physician (Obstetrics and Gynecology) Bernette Redbird, MD as Consulting Physician (Gastroenterology) Ronney Asters May, MD as Referring Physician (Otolaryngology) Berneice Gandy, MD  as Referring Physician (Dermatology)  Allergies:  Allergies  Allergen Reactions  . Ace Inhibitors Cough    Other reaction(s): Other (See Comments) Made her cough  . Pneumococcal Vaccine     Other reaction(s): Other (See Comments) Caused arm to swell   Medical History:  Past Medical History  Diagnosis Date  . Hyperlipidemia   . Hypertension   . Prediabetes   . Thyroid disease   . Vitamin D deficiency   . Allergy   . Rheumatoid arthritis [facility])    Surgical History:  Past Surgical History  Procedure Laterality Date  . Cochlear implant Right 2007  . Appendectomy    . Tubal ligation Bilateral   . Cholecystectomy    . Other surgical history      4 ear surgeries  . Ankle arthroscopy with open reduction internal fixation (orif) Right 2004  .  Other surgical history      Right temple bone surgery   Family History:  Family History  Problem Relation Age of Onset  . Heart attack Mother   . Hypertension Mother   . Lymphoma Mother    Social History:  Social History  Substance Use Topics  . Smoking status: Former Smoker    Quit date: 09/08/1988  . Smokeless tobacco: Never Used  . Alcohol Use: No   MEDICARE WELLNESS OBJECTIVES: Tobacco use: She does not smoke.  Patient is a former smoker. If yes, counseling given Alcohol Current alcohol use: none Diet: in general, a "healthy" diet   Physical activity: Current Exercise Habits: The patient does not participate in regular exercise at present Cardiac risk factors: Cardiac Risk Factors include: advanced age (>61men, >35 women);obesity (BMI >30kg/m2);hypertension;sedentary lifestyle;dyslipidemia Depression/mood screen:   Depression screen [facility] 2/9 10/26/2015  Decreased Interest 0  Down, Depressed, Hopeless 0  PHQ - 2 Score 0    ADLs:  In your present state of health, do you have any difficulty performing the following activities: 10/26/2015  Hearing? Y  Vision? N  Difficulty concentrating or making decisions? N  Walking or climbing stairs? N  Dressing or bathing? N  Doing errands, shopping? N     Cognitive Testing  Alert? Yes  Normal Appearance?Yes  Oriented to person? Yes  Place? Yes   Time? Yes  Recall of three objects?  Yes  Can perform simple calculations? Yes  Displays appropriate judgment?Yes  Can read the correct time from a watch face?Yes  EOL planning: Does patient have an advance directive?: Yes Type of Advance Directive: Healthcare Power of Attorney, Living will Does patient want to make changes to advanced directive?: No - Patient declined Copy of advanced directive(s) in chart?: No - copy requested   Review of Systems: Review of Systems  Constitutional: Negative.   HENT: Positive for hearing loss. Negative for congestion, ear discharge, ear pain,  nosebleeds, sore throat and tinnitus.   Eyes: Negative.   Respiratory: Negative for cough, hemoptysis, sputum production, shortness of breath, wheezing and stridor.   Cardiovascular: Negative.  Negative for chest pain, palpitations and leg swelling.  Gastrointestinal: Positive for heartburn (has been trying to get off prilosec). Negative for nausea, vomiting, abdominal pain, diarrhea, constipation, blood in stool and melena.  Genitourinary: Negative.   Musculoskeletal: Positive for myalgias (right foot). Negative for back pain, joint pain, falls and neck pain.  Skin: Negative.   Neurological: Negative.  Negative for headaches.  Psychiatric/Behavioral: Negative for depression, suicidal ideas, hallucinations, memory loss and substance abuse. The patient is not nervous/anxious and does not have insomnia.  Physical Exam: Estimated body mass index is 31.94 kg/(m^2) as calculated from the following:   Height as of this encounter: 5\' 6"  (1.676 m).   Weight as of this encounter: 197 lb 12.8 oz (89.721 kg). BP 130/80 mmHg  Pulse 85  Temp(Src) 97.3 F (36.3 C) (Temporal)  Resp 16  Ht 5\' 6"  (1.676 m)  Wt 197 lb 12.8 oz (89.721 kg)  BMI 31.94 kg/m2  SpO2 96% General Appearance: Well nourished, in no apparent distress.  Eyes: PERRLA, EOMs, conjunctiva no swelling or erythema, normal fundi and vessels.  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 Tonsils not swollen or erythematous. Hearing impaired, has hearing aids Neck: Supple, thyroid normal. No bruits  Respiratory: Respiratory effort normal,  without wheezing, rales, rhonchi,or stridor.  Cardio: RRR without murmurs, rubs or gallops. Brisk peripheral pulses without edema.  Chest: symmetric, with normal excursions and percussion.  Breasts: defer Abdomen: Soft,  nontender,  no guarding, rebound, hernias, masses, or organomegaly. .  Lymphatics: Non tender without lymphadenopathy.  Genitourinary: defer Musculoskeletal: Full ROM all peripheral extremities,5/5 strength, and normal antalgic, right leg without swelling, edema, warmth, redness, + right heel pain with flexion and TTP, no warmth, redness, good pulses and sensation distally.  Skin: several seb derm keratosis, skin tag left axilla. Warm, dry without rashes, lesions, ecchymosis. Neuro: Cranial nerves intact, reflexes equal bilaterally. Normal muscle tone, no cerebellar symptoms. Sensation intact.  Psych: Awake and oriented X 3, normal affect, Insight and Judgment appropriate.   Medicare Attestation I have personally reviewed: The patient's medical and social history Their use of alcohol, tobacco or illicit drugs Their current medications and supplements The patient's functional ability including ADLs,fall risks, home safety risks, cognitive, and hearing and visual impairment Diet and physical activities Evidence for depression or mood disorders  The patient's weight, height, BMI, and visual acuity have been recorded in the chart.  I have made referrals, counseling, and provided education to the patient based on review of the above and I have provided the patient with a written personalized care plan for preventive services.  Over 40 minutes of exam, counseling, chart review was performed.  Quentin Mulling 10:20 AM [facility]

## 2015-10-26 NOTE — Patient Instructions (Signed)

## 2015-11-19 ENCOUNTER — Ambulatory Visit (INDEPENDENT_AMBULATORY_CARE_PROVIDER_SITE_OTHER): Payer: Medicare Other | Admitting: Physician Assistant

## 2015-11-19 ENCOUNTER — Encounter: Payer: Self-pay | Admitting: Physician Assistant

## 2015-11-19 VITALS — BP 156/84 | HR 86 | Temp 98.4°F | Resp 16 | Ht 66.0 in

## 2015-11-19 DIAGNOSIS — L570 Actinic keratosis: Secondary | ICD-10-CM | POA: Diagnosis not present

## 2015-11-19 DIAGNOSIS — I10 Essential (primary) hypertension: Secondary | ICD-10-CM | POA: Diagnosis not present

## 2015-11-19 DIAGNOSIS — M069 Rheumatoid arthritis, unspecified: Secondary | ICD-10-CM

## 2015-11-19 DIAGNOSIS — M79671 Pain in right foot: Secondary | ICD-10-CM

## 2015-11-19 MED ORDER — DEXAMETHASONE SODIUM PHOSPHATE 100 MG/10ML IJ SOLN
10.0000 mg | Freq: Once | INTRAMUSCULAR | Status: AC
  Administered 2015-11-19: 10 mg via INTRAMUSCULAR

## 2015-11-19 NOTE — Progress Notes (Signed)
   Subjective:    Patient ID: Angelica Parker, female    DOB: 09-Feb-1941, 75 y.o.   MRN: [id number]  HPI 75 y.o. WF presents for follow up for right heel pain, was treated with mobic, stretches on 10/26/2015, not any better. Better with rest. Heel pain, pain with stepping down in the AM, and worse at night. No injury. Worse with walking on it, worse at night, throbbing pain. No swelling, warmth, redness, discharge, numbness/tingling. Has been checking BP at home, states has been elevated at home, has been taking mobic daily.  BP Readings from Last 3 Encounters:  11/19/15 156/84  10/26/15 130/80  09/30/15 126/70  .   Blood pressure 156/84, pulse 86, temperature 98.4 F (36.9 C), temperature source Temporal, resp. rate 16, height 5\' 6"  (1.676 m).   Review of Systems  Constitutional: Negative.   HENT: Positive for hearing loss. Negative for congestion, ear discharge, ear pain, nosebleeds, sore throat and tinnitus.   Eyes: Negative.   Respiratory: Negative for cough, shortness of breath, wheezing and stridor.   Cardiovascular: Negative.  Negative for chest pain, palpitations and leg swelling.  Gastrointestinal: Negative for nausea, vomiting, abdominal pain, diarrhea, constipation and blood in stool.  Genitourinary: Negative.   Musculoskeletal: Positive for myalgias (right foot). Negative for back pain and neck pain.  Skin: Negative.   Neurological: Negative.  Negative for headaches.  Psychiatric/Behavioral: Negative for suicidal ideas and hallucinations. The patient is not nervous/anxious.        Objective:   Physical Exam  Constitutional: She is oriented to person, place, and time. She appears well-developed and well-nourished.  HENT:  Head: Normocephalic and atraumatic.  Eyes: Conjunctivae and EOM are normal. Pupils are equal, round, and reactive to light.  Neck: Normal range of motion. Neck supple.  Cardiovascular: Normal rate and regular rhythm.   No murmur  heard. Pulmonary/Chest: Effort normal and breath sounds normal.  Musculoskeletal: She exhibits no edema.  + right heel pain with flexion and TTP, no warmth, redness, good pulses and sensation distally.   Neurological: She is alert and oriented to person, place, and time. No cranial nerve deficit. Coordination normal.  Skin: Skin is warm and dry.  several seb derm keratosis on chest, with some actinic keratosis on face      Assessment & Plan:  1. Essential hypertension Stop mobic, monitor BP at home, call if above 140/90  2. Rheumatoid arthritis, involving unspecified site, unspecified rheumatoid factor presence (HCC) Continue medications, follow up rheum  3. Heel pain, right Continue conservative measure if not better than will refer podiatry - benefits and risks explained with the patient, verbal consent given, area cleaned with alcohol, and lidocaine and decadron used, patient tolerated well with relief.  - dexamethasone (DECADRON) injection 10 mg; Inject 1 mL (10 mg total) into the muscle once.  4. Actinic keratosis 3 freeze and thaw technique to right face/forehead, 6 areas, tolerated well.

## 2015-11-19 NOTE — Patient Instructions (Signed)

## 2015-12-06 ENCOUNTER — Other Ambulatory Visit: Payer: Self-pay | Admitting: Internal Medicine

## 2015-12-06 NOTE — Telephone Encounter (Signed)
RX SENT TO PHARMACY

## 2015-12-29 ENCOUNTER — Ambulatory Visit: Payer: Self-pay | Admitting: Internal Medicine

## 2016-01-03 ENCOUNTER — Encounter: Payer: Self-pay | Admitting: Internal Medicine

## 2016-01-03 ENCOUNTER — Ambulatory Visit (INDEPENDENT_AMBULATORY_CARE_PROVIDER_SITE_OTHER): Payer: Medicare Other | Admitting: Internal Medicine

## 2016-01-03 VITALS — BP 136/76 | HR 84 | Temp 97.2°F | Ht 66.0 in | Wt 201.6 lb

## 2016-01-03 DIAGNOSIS — E559 Vitamin D deficiency, unspecified: Secondary | ICD-10-CM

## 2016-01-03 DIAGNOSIS — R7309 Other abnormal glucose: Secondary | ICD-10-CM

## 2016-01-03 DIAGNOSIS — E785 Hyperlipidemia, unspecified: Secondary | ICD-10-CM | POA: Diagnosis not present

## 2016-01-03 DIAGNOSIS — Z79899 Other long term (current) drug therapy: Secondary | ICD-10-CM | POA: Diagnosis not present

## 2016-01-03 DIAGNOSIS — I10 Essential (primary) hypertension: Secondary | ICD-10-CM | POA: Diagnosis not present

## 2016-01-03 LAB — CBC WITH DIFFERENTIAL/PLATELET
BASOS ABS: 68 {cells}/uL (ref 0–200)
Basophils Relative: 1 %
EOS ABS: 408 {cells}/uL (ref 15–500)
EOS PCT: 6 %
HCT: 41.3 % (ref 35.0–45.0)
HEMOGLOBIN: 13.8 g/dL (ref 11.7–15.5)
LYMPHS ABS: 2584 {cells}/uL (ref 850–3900)
Lymphocytes Relative: 38 %
MCH: 31.2 pg (ref 27.0–33.0)
MCHC: 33.4 g/dL (ref 32.0–36.0)
MCV: 93.2 fL (ref 80.0–100.0)
MONO ABS: 476 {cells}/uL (ref 200–950)
MPV: 9.9 fL (ref 7.5–12.5)
Monocytes Relative: 7 %
NEUTROS ABS: 3264 {cells}/uL (ref 1500–7800)
Neutrophils Relative %: 48 %
Platelets: 279 10*3/uL (ref 140–400)
RBC: 4.43 MIL/uL (ref 3.80–5.10)
RDW: 14.6 % (ref 11.0–15.0)
WBC: 6.8 10*3/uL (ref 3.8–10.8)

## 2016-01-03 LAB — BASIC METABOLIC PANEL WITH GFR
BUN: 15 mg/dL (ref 7–25)
CALCIUM: 9.4 mg/dL (ref 8.6–10.4)
CO2: 26 mmol/L (ref 20–31)
Chloride: 100 mmol/L (ref 98–110)
Creat: 0.79 mg/dL (ref 0.60–0.93)
GFR, EST AFRICAN AMERICAN: 84 mL/min (ref >=60)
GFR, EST NON AFRICAN AMERICAN: 73 mL/min (ref >=60)
Glucose, Bld: 104 mg/dL — ABNORMAL HIGH (ref 65–99)
Potassium: 4.1 mmol/L (ref 3.5–5.3)
SODIUM: 136 mmol/L (ref 135–146)

## 2016-01-03 LAB — LIPID PANEL
CHOL/HDL RATIO: 3.3 ratio (ref <=5.0)
CHOLESTEROL: 167 mg/dL (ref 125–200)
HDL: 51 mg/dL (ref >=46)
LDL Cholesterol: 95 mg/dL (ref <130)
Triglycerides: 103 mg/dL (ref <150)
VLDL: 21 mg/dL (ref <30)

## 2016-01-03 LAB — TSH: TSH: 2.47 m[IU]/L

## 2016-01-03 LAB — MAGNESIUM: MAGNESIUM: 1.7 mg/dL (ref 1.5–2.5)

## 2016-01-03 LAB — HEPATIC FUNCTION PANEL
ALBUMIN: 3.8 g/dL (ref 3.6–5.1)
ALT: 17 U/L (ref 6–29)
AST: 14 U/L (ref 10–35)
Alkaline Phosphatase: 91 U/L (ref 33–130)
BILIRUBIN DIRECT: 0.1 mg/dL (ref <=0.2)
BILIRUBIN TOTAL: 0.5 mg/dL (ref 0.2–1.2)
Indirect Bilirubin: 0.4 mg/dL (ref 0.2–1.2)
Total Protein: 6.4 g/dL (ref 6.1–8.1)

## 2016-01-03 NOTE — Patient Instructions (Signed)

## 2016-01-03 NOTE — Progress Notes (Signed)
[facility]                       Angelica Parker, M.D.        Dyanne Carrel. Steffanie Dunn, P.A.-C       Terri Piedra, P.A.-C   [facility]                [address]                [place], [place]. [address] Telephone [phone] Telefax [phone] ______________________________________________________________________     This very nice 75y.o.WWF presents for follow-up with Hypertension, Hyperlipidemia, Pre-Diabetes, Hypothyroidism, Rheumatoid Arthritis and Vitamin D Deficiency. Patient was dx'd with Rheumatoid Arthritis in 2010 and is followed by Dr Kathi Ludwig and seems in good control on her current meds.      Patient is treated for HTN circa 1999 & BP has been controlled at home. Today's BP: 136/76. Patient has had no complaints of any cardiac type chest pain, palpitations, dyspnea/orthopnea/PND, dizziness, claudication, or dependent edema.      Hyperlipidemia is controlled with diet.  Last Lipids were at goal: Lab Results  Component Value Date   CHOL 167 01/03/2016   HDL 51 01/03/2016   LDLCALC 95 01/03/2016   TRIG 103 01/03/2016   CHOLHDL 3.3 01/03/2016      Also, the patient has history of Morbid Obesity (BMI 32+) and consequent PreDiabetes and has had no symptoms of reactive hypoglycemia, diabetic polys, paresthesias or visual blurring.  Last A1c was not at goal: Lab Results  Component Value Date   HGBA1C 6.1 (H) 09/30/2015      Patient has been on Thyroid Replacement since the 1990's.  Further, the patient also has history of Vitamin D Deficiency and supplements vitamin D without any suspected side-effects. Last vitamin D was   Lab Results  Component Value Date   VD25OH 63 06/22/2015   Current Outpatient Prescriptions on File Prior to Visit  Medication Sig  . Adalimumab (HUMIRA PEN) 40 MG/0.8ML PNKT   . aspirin EC 81 MG tablet Take 81 mg by mouth.  . calcium carbonate (OS-CAL) 600 MG TABS tablet Take 600 mg  by mouth 2 (two) times daily with a meal.  . Cholecalciferol (VITAMIN D3) 2000 units capsule Take by mouth.  . citalopram (CELEXA) 40 MG tablet take 1 tablet by mouth once daily  . Fluticasone-Salmeterol (ADVAIR DISKUS) 100-50 MCG/DOSE AEPB Inhale 1 dose by mouth twice a day.  . folic acid (FOLVITE) 1 MG tablet Take by mouth daily.  Marland Kitchen glucose blood (FREESTYLE LITE) test strip Test Blood Sugar one time daily  . hydrochlorothiazide (HYDRODIURIL) 25 MG tablet take 1 tablet by mouth once daily for blood pressure and FLUID RETENTION  . levothyroxine (SYNTHROID, LEVOTHROID) 112 MCG tablet take 1 tablet by mouth once daily before BREAKFAST  . lidocaine (XYLOCAINE) 2 % jelly apply affected area if needed as directed by prescriber  . losartan (COZAAR) 100 MG tablet take 1 tablet by mouth once daily  . Magnesium 400 MG CAPS Take 400 mg by mouth 2 (two) times daily.  . methotrexate (RHEUMATREX) 2.5 MG tablet Take by mouth. Takes 8 pills on Fridays  . montelukast (SINGULAIR) 10 MG tablet Take 1 tablet (10 mg total) by mouth daily.  . Omega-3 Fatty Acids (FISH OIL PO) Take by mouth 2 (two) times daily.   . prednisoLONE (PRELONE) 15 MG/5ML SOLN Place a thin layer on the scalp as  needed for scalp itching twice daily.   No current facility-administered medications on file prior to visit.    Allergies  Allergen Reactions  . Ace Inhibitors Cough    Other reaction(s): Other (See Comments) Made her cough  . Pneumococcal Vaccine     Other reaction(s): Other (See Comments) Caused arm to swell   PMHx:   Past Medical History:  Diagnosis Date  . Allergy   . Hyperlipidemia   . Hypertension   . Prediabetes   . Rheumatoid arthritis (HCC)   . Thyroid disease   . Vitamin D deficiency    Immunization History  Administered Date(s) Administered  . DT 06/22/2015  . Influenza, High Dose Seasonal PF 02/03/2014, 02/14/2015  . Pneumococcal Conjugate-13 07/16/2014  . Pneumococcal Polysaccharide-23 05/30/2003,  09/30/2015  . Td 05/29/2005  . Zoster 05/29/2006   Past Surgical History:  Procedure Laterality Date  . ANKLE ARTHROSCOPY WITH OPEN REDUCTION INTERNAL FIXATION (ORIF) Right 2004  . APPENDECTOMY    . CHOLECYSTECTOMY    . COCHLEAR IMPLANT Right 2007  . OTHER SURGICAL HISTORY     4 ear surgeries  . OTHER SURGICAL HISTORY     Right temple bone surgery  . TUBAL LIGATION Bilateral    FHx:    Reviewed / unchanged  SHx:    Reviewed / unchanged  Systems Review:  Constitutional: Denies fever, chills, wt changes, headaches, insomnia, fatigue, night sweats, change in appetite. Eyes: Denies redness, blurred vision, diplopia, discharge, itchy, watery eyes.  ENT: Denies discharge, congestion, post nasal drip, epistaxis, sore throat, earache, hearing loss, dental pain, tinnitus, vertigo, sinus pain, snoring.  CV: Denies chest pain, palpitations, irregular heartbeat, syncope, dyspnea, diaphoresis, orthopnea, PND, claudication or edema. Respiratory: denies cough, dyspnea, DOE, pleurisy, hoarseness, laryngitis, wheezing.  Gastrointestinal: Denies dysphagia, odynophagia, heartburn, reflux, water brash, abdominal pain or cramps, nausea, vomiting, bloating, diarrhea, constipation, hematemesis, melena, hematochezia  or hemorrhoids. Genitourinary: Denies dysuria, frequency, urgency, nocturia, hesitancy, discharge, hematuria or flank pain. Musculoskeletal: Denies arthralgias, myalgias, stiffness, jt. swelling, pain, limping or strain/sprain.  Skin: Denies pruritus, rash, hives, warts, acne, eczema or change in skin lesion(s). Neuro: No weakness, tremor, incoordination, spasms, paresthesia or pain. Psychiatric: Denies confusion, memory loss or sensory loss. Endo: Denies change in weight, skin or hair change.  Heme/Lymph: No excessive bleeding, bruising or enlarged lymph nodes.  Physical Exam  BP 136/76   Pulse 84   Temp 97.2 F (36.2 C)   Ht 5\' 6"  (1.676 m)   Wt 201 lb 9.6 oz (91.4 kg)   BMI 32.54  kg/m   Appears well nourished and in no distress. Eyes: PERRLA, EOMs, conjunctiva no swelling or erythema. Sinuses: No frontal/maxillary tenderness ENT/Mouth: EAC's clear, TM's nl w/o erythema, bulging. Nares clear w/o erythema, swelling, exudates. Oropharynx clear without erythema or exudates. Oral hygiene is good. Tongue normal, non obstructing. Hearing intact.  Neck: Supple. Thyroid nl. Car 2+/2+ without bruits, nodes or JVD. Chest: Respirations nl with BS clear & equal w/o rales, rhonchi, wheezing or stridor.  Cor: Heart sounds normal w/ regular rate and rhythm without sig. murmurs, gallops, clicks, or rubs. Peripheral pulses normal and equal  without edema.  Abdomen: Soft & bowel sounds normal. Non-tender w/o guarding, rebound, hernias, masses, or organomegaly.  Lymphatics: Unremarkable.  Musculoskeletal: Full ROM all peripheral extremities, joint stability, 5/5 strength, and normal gait.  Skin: Warm, dry without exposed rashes, lesions or ecchymosis apparent.  Neuro: Cranial nerves intact, reflexes equal bilaterally. Sensory-motor testing grossly intact. Tendon reflexes grossly intact.  Pysch:  Alert & oriented x 3.  Insight and judgement nl & appropriate. No ideations.  Assessment and Plan:  1. Essential hypertension  - Continue medication, monitor blood pressure at home. Continue DASH diet. Reminder to go to the ER if any CP, SOB, nausea, dizziness, severe HA, changes vision/speech, left arm numbness and tingling and jaw pain. - TSH  2. Hyperlipemia  - Continue diet/meds, exercise,& lifestyle modifications. Continue monitor periodic cholesterol/liver & renal functions  - Lipid panel - TSH  3. Other abnormal glucose  - Continue diet, exercise, lifestyle modifications. Monitor appropriate labs. - Hemoglobin A1c - Insulin, random  4. Vitamin D deficiency  - Continue supplementation. - VITAMIN D 25 Hydroxy   5. Medication management  - CBC with Differential/Platelet -  BASIC METABOLIC PANEL WITH GFR - Hepatic function panel - Magnesium   Recommended regular exercise, BP monitoring, weight control, and discussed med and SE's. Recommended labs to assess and monitor clinical status. Further disposition pending results of labs. Over 30 minutes of exam, counseling, chart review was performed

## 2016-01-04 LAB — HEMOGLOBIN A1C
HEMOGLOBIN A1C: 5.9 % — AB (ref <5.7)
Mean Plasma Glucose: 123 mg/dL

## 2016-01-04 LAB — INSULIN, RANDOM: INSULIN: 20.8 u[IU]/mL — AB (ref 2.0–19.6)

## 2016-01-04 LAB — VITAMIN D 25 HYDROXY (VIT D DEFICIENCY, FRACTURES): VIT D 25 HYDROXY: 56 ng/mL (ref 30–100)

## 2016-02-29 ENCOUNTER — Other Ambulatory Visit: Payer: Self-pay | Admitting: Internal Medicine

## 2016-03-28 ENCOUNTER — Other Ambulatory Visit: Payer: Self-pay | Admitting: Internal Medicine

## 2016-03-28 DIAGNOSIS — R059 Cough, unspecified: Secondary | ICD-10-CM

## 2016-03-28 DIAGNOSIS — T7840XS Allergy, unspecified, sequela: Secondary | ICD-10-CM

## 2016-03-28 DIAGNOSIS — R05 Cough: Secondary | ICD-10-CM

## 2016-04-18 ENCOUNTER — Ambulatory Visit (INDEPENDENT_AMBULATORY_CARE_PROVIDER_SITE_OTHER): Payer: Medicare Other | Admitting: Internal Medicine

## 2016-04-18 ENCOUNTER — Encounter: Payer: Self-pay | Admitting: Internal Medicine

## 2016-04-18 VITALS — BP 124/62 | HR 78 | Temp 98.4°F | Resp 16 | Ht 66.0 in | Wt 197.0 lb

## 2016-04-18 DIAGNOSIS — E559 Vitamin D deficiency, unspecified: Secondary | ICD-10-CM | POA: Diagnosis not present

## 2016-04-18 DIAGNOSIS — N182 Chronic kidney disease, stage 2 (mild): Secondary | ICD-10-CM | POA: Diagnosis not present

## 2016-04-18 DIAGNOSIS — E039 Hypothyroidism, unspecified: Secondary | ICD-10-CM | POA: Diagnosis not present

## 2016-04-18 DIAGNOSIS — I10 Essential (primary) hypertension: Secondary | ICD-10-CM

## 2016-04-18 DIAGNOSIS — J449 Chronic obstructive pulmonary disease, unspecified: Secondary | ICD-10-CM

## 2016-04-18 DIAGNOSIS — E782 Mixed hyperlipidemia: Secondary | ICD-10-CM

## 2016-04-18 DIAGNOSIS — M069 Rheumatoid arthritis, unspecified: Secondary | ICD-10-CM

## 2016-04-18 DIAGNOSIS — Z79899 Other long term (current) drug therapy: Secondary | ICD-10-CM | POA: Diagnosis not present

## 2016-04-18 LAB — TSH: TSH: 3.26 mIU/L

## 2016-04-18 LAB — LIPID PANEL
Cholesterol: 179 mg/dL (ref <200)
HDL: 50 mg/dL — ABNORMAL LOW (ref >50)
LDL CALC: 107 mg/dL — AB (ref <100)
Total CHOL/HDL Ratio: 3.6 Ratio (ref <5.0)
Triglycerides: 112 mg/dL (ref <150)
VLDL: 22 mg/dL (ref <30)

## 2016-04-18 LAB — HEPATIC FUNCTION PANEL
ALBUMIN: 4.1 g/dL (ref 3.6–5.1)
ALK PHOS: 75 U/L (ref 33–130)
ALT: 18 U/L (ref 6–29)
AST: 16 U/L (ref 10–35)
BILIRUBIN INDIRECT: 0.6 mg/dL (ref 0.2–1.2)
Bilirubin, Direct: 0.1 mg/dL (ref <=0.2)
TOTAL PROTEIN: 6.8 g/dL (ref 6.1–8.1)
Total Bilirubin: 0.7 mg/dL (ref 0.2–1.2)

## 2016-04-18 LAB — HEMOGLOBIN A1C
HEMOGLOBIN A1C: 5.8 % — AB (ref <5.7)
MEAN PLASMA GLUCOSE: 120 mg/dL

## 2016-04-18 LAB — BASIC METABOLIC PANEL WITH GFR
BUN: 14 mg/dL (ref 7–25)
CHLORIDE: 99 mmol/L (ref 98–110)
CO2: 29 mmol/L (ref 20–31)
Calcium: 9.6 mg/dL (ref 8.6–10.4)
Creat: 0.83 mg/dL (ref 0.60–0.93)
GFR, Est African American: 79 mL/min (ref >=60)
GFR, Est Non African American: 69 mL/min (ref >=60)
Glucose, Bld: 93 mg/dL (ref 65–99)
POTASSIUM: 4 mmol/L (ref 3.5–5.3)
Sodium: 137 mmol/L (ref 135–146)

## 2016-04-18 LAB — CBC WITH DIFFERENTIAL/PLATELET
Basophils Absolute: 62 cells/uL (ref 0–200)
Basophils Relative: 1 %
EOS PCT: 6 %
Eosinophils Absolute: 372 cells/uL (ref 15–500)
HCT: 41.6 % (ref 35.0–45.0)
HEMOGLOBIN: 13.6 g/dL (ref 11.7–15.5)
LYMPHS ABS: 2604 {cells}/uL (ref 850–3900)
Lymphocytes Relative: 42 %
MCH: 31.3 pg (ref 27.0–33.0)
MCHC: 32.7 g/dL (ref 32.0–36.0)
MCV: 95.9 fL (ref 80.0–100.0)
MPV: 10.4 fL (ref 7.5–12.5)
Monocytes Absolute: 372 cells/uL (ref 200–950)
Monocytes Relative: 6 %
NEUTROS ABS: 2790 {cells}/uL (ref 1500–7800)
Neutrophils Relative %: 45 %
Platelets: 311 10*3/uL (ref 140–400)
RBC: 4.34 MIL/uL (ref 3.80–5.10)
RDW: 15.6 % — ABNORMAL HIGH (ref 11.0–15.0)
WBC: 6.2 10*3/uL (ref 3.8–10.8)

## 2016-04-18 NOTE — Progress Notes (Signed)
Assessment and Plan:  Hypertension:  -Continue medication,  -monitor blood pressure at home.  -Continue DASH diet.   -Reminder to go to the ER if any CP, SOB, nausea, dizziness, severe HA, changes vision/speech, left arm numbness and tingling, and jaw pain.  Cholesterol: -Continue diet and exercise.  -Check cholesterol.   Pre-diabetes: -Continue diet and exercise.  -Check A1C  Vitamin D Def: -check level -continue medications.   COPD -cont inhalers -cont singulair   Rheumatoid arthritis -cont following with Dr. Kathi Ludwig -cont humira and methotrexate -send LFTs to Dr. Kathi Ludwig  Continue diet and meds as discussed. Further disposition pending results of labs.  HPI 75 y.o. female  presents for 3 month follow up with hypertension, hyperlipidemia, prediabetes and vitamin D.   Her blood pressure has been controlled at home, today their BP is BP: 124/62.   She does not workout. She denies chest pain, shortness of breath, dizziness.  She notes that when she was checking at home during her headaches she was having some issues with higher blood pressures.  She reports that the headaches were being caused by ear pain which resolved when she started using her ear drops the audiologist gave to her.   She is on cholesterol medication and denies myalgias. Her cholesterol is at goal. The cholesterol last visit was:   Lab Results  Component Value Date   CHOL 167 01/03/2016   HDL 51 01/03/2016   LDLCALC 95 01/03/2016   TRIG 103 01/03/2016   CHOLHDL 3.3 01/03/2016     She has been working on diet and exercise for prediabetes, and denies foot ulcerations, hyperglycemia, hypoglycemia , increased appetite, nausea, paresthesia of the feet, polydipsia, polyuria, visual disturbances, vomiting and weight loss. Last A1C in the office was:  Lab Results  Component Value Date   HGBA1C 5.9 (H) 01/03/2016    Patient is on Vitamin D supplement.  Lab Results  Component Value Date   VD25OH 52 01/03/2016      She is doing well with her humira.  She does still have some joint pains and aches.  She is following with Dr. Kathi Ludwig.    Her COPD is doing well.  She has not had any shortness of breath or wheezing recently.    Current Medications:  Current Outpatient Prescriptions on File Prior to Visit  Medication Sig Dispense Refill  . Adalimumab (HUMIRA PEN) 40 MG/0.8ML PNKT     . aspirin EC 81 MG tablet Take 81 mg by mouth.    . calcium carbonate (OS-CAL) 600 MG TABS tablet Take 600 mg by mouth 2 (two) times daily with a meal.    . Cholecalciferol (VITAMIN D3) 2000 units capsule Take by mouth.    . citalopram (CELEXA) 40 MG tablet take 1 tablet by mouth once daily 90 tablet 1  . Fluticasone-Salmeterol (ADVAIR DISKUS) 100-50 MCG/DOSE AEPB Inhale 1 dose by mouth twice a day. 60 each 1  . folic acid (FOLVITE) 1 MG tablet Take by mouth daily.    Marland Kitchen glucose blood (FREESTYLE LITE) test strip Test Blood Sugar one time daily 100 each prn  . hydrochlorothiazide (HYDRODIURIL) 25 MG tablet take 1 tablet by mouth once daily for blood pressure and FLUID RETENTION 90 tablet 1  . levothyroxine (SYNTHROID, LEVOTHROID) 112 MCG tablet take 1 tablet by mouth once daily BEFORE BREAKFAST 90 tablet 1  . lidocaine (XYLOCAINE) 2 % jelly apply affected area if needed as directed by prescriber 30 mL 1  . losartan (COZAAR) 100 MG tablet  take 1 tablet by mouth once daily 90 tablet 2  . Magnesium 400 MG CAPS Take 400 mg by mouth 2 (two) times daily.    . methotrexate (RHEUMATREX) 2.5 MG tablet Take by mouth. Takes 8 pills on Fridays    . montelukast (SINGULAIR) 10 MG tablet take 1 tablet by mouth once daily 90 tablet 1  . Omega-3 Fatty Acids (FISH OIL PO) Take by mouth 2 (two) times daily.      No current facility-administered medications on file prior to visit.     Medical History:  Past Medical History:  Diagnosis Date  . Allergy   . Hyperlipidemia   . Hypertension   . Prediabetes   . Rheumatoid arthritis (HCC)   .  Thyroid disease   . Vitamin D deficiency     Allergies:  Allergies  Allergen Reactions  . Ace Inhibitors Cough    Other reaction(s): Other (See Comments) Made her cough  . Pneumococcal Vaccine     Other reaction(s): Other (See Comments) Caused arm to swell     Review of Systems:  Review of Systems  Constitutional: Negative for chills, fever and malaise/fatigue.  HENT: Negative for congestion, ear pain and sore throat.   Eyes: Negative.   Respiratory: Negative for cough, shortness of breath and wheezing.   Cardiovascular: Negative for chest pain, palpitations and leg swelling.  Gastrointestinal: Negative for abdominal pain, blood in stool, constipation, diarrhea, heartburn and melena.  Genitourinary: Negative.   Skin: Negative.   Neurological: Negative for dizziness, sensory change, loss of consciousness and headaches.  Psychiatric/Behavioral: Negative for depression. The patient is not nervous/anxious and does not have insomnia.     Family history- Review and unchanged  Social history- Review and unchanged  Physical Exam: BP 124/62   Pulse 78   Temp 98.4 F (36.9 C) (Temporal)   Resp 16   Ht 5\' 6"  (1.676 m)   Wt 197 lb (89.4 kg)   BMI 31.80 kg/m  Wt Readings from Last 3 Encounters:  04/18/16 197 lb (89.4 kg)  01/03/16 201 lb 9.6 oz (91.4 kg)  10/26/15 197 lb 12.8 oz (89.7 kg)    General Appearance: Well nourished well developed, in no apparent distress. Eyes: PERRLA, EOMs, conjunctiva no swelling or erythema ENT/Mouth: Ear canals normal without obstruction, swelling, erythma, discharge.  TMs normal bilaterally.  Oropharynx moist, clear, without exudate, or postoropharyngeal swelling. Neck: Supple, thyroid normal,no cervical adenopathy  Respiratory: Respiratory effort normal, Breath sounds clear A&P without rhonchi, wheeze, or rale.  No retractions, no accessory usage. Cardio: RRR with no MRGs. Brisk peripheral pulses without edema.  Abdomen: Soft, + BS,  Non  tender, no guarding, rebound, hernias, masses. Musculoskeletal: Full ROM, 5/5 strength, Normal gait Skin: Warm, dry without rashes, lesions, ecchymosis.  Neuro: Awake and oriented X 3, Cranial nerves intact. Normal muscle tone, no cerebellar symptoms. Psych: Normal affect, Insight and Judgment appropriate.    10/28/15, PA-C 9:05 AM [facility]

## 2016-05-18 DIAGNOSIS — Z9621 Cochlear implant status: Secondary | ICD-10-CM | POA: Insufficient documentation

## 2016-06-08 ENCOUNTER — Encounter: Payer: Self-pay | Admitting: Physician Assistant

## 2016-06-08 ENCOUNTER — Other Ambulatory Visit: Payer: Self-pay | Admitting: Physician Assistant

## 2016-06-22 ENCOUNTER — Encounter: Payer: Self-pay | Admitting: Physician Assistant

## 2016-07-10 ENCOUNTER — Other Ambulatory Visit: Payer: Self-pay | Admitting: Internal Medicine

## 2016-07-20 ENCOUNTER — Ambulatory Visit (HOSPITAL_COMMUNITY): Payer: 59

## 2016-07-20 ENCOUNTER — Ambulatory Visit (HOSPITAL_COMMUNITY)
Admission: RE | Admit: 2016-07-20 | Discharge: 2016-07-20 | Disposition: A | Discharge status: Home / Self Care | Payer: Medicare Other | Source: Ambulatory Visit | Attending: Physician Assistant | Admitting: Physician Assistant

## 2016-07-20 ENCOUNTER — Ambulatory Visit (INDEPENDENT_AMBULATORY_CARE_PROVIDER_SITE_OTHER): Payer: Medicare Other | Admitting: Physician Assistant

## 2016-07-20 ENCOUNTER — Encounter: Payer: Self-pay | Admitting: Physician Assistant

## 2016-07-20 VITALS — BP 120/78 | HR 82 | Temp 97.9°F | Resp 16 | Wt 202.2 lb

## 2016-07-20 DIAGNOSIS — Z0001 Encounter for general adult medical examination with abnormal findings: Secondary | ICD-10-CM

## 2016-07-20 DIAGNOSIS — J449 Chronic obstructive pulmonary disease, unspecified: Secondary | ICD-10-CM

## 2016-07-20 DIAGNOSIS — M069 Rheumatoid arthritis, unspecified: Secondary | ICD-10-CM

## 2016-07-20 DIAGNOSIS — E039 Hypothyroidism, unspecified: Secondary | ICD-10-CM

## 2016-07-20 DIAGNOSIS — F325 Major depressive disorder, single episode, in full remission: Secondary | ICD-10-CM

## 2016-07-20 DIAGNOSIS — N182 Chronic kidney disease, stage 2 (mild): Secondary | ICD-10-CM

## 2016-07-20 DIAGNOSIS — E782 Mixed hyperlipidemia: Secondary | ICD-10-CM | POA: Diagnosis not present

## 2016-07-20 DIAGNOSIS — E559 Vitamin D deficiency, unspecified: Secondary | ICD-10-CM

## 2016-07-20 DIAGNOSIS — R7309 Other abnormal glucose: Secondary | ICD-10-CM | POA: Diagnosis not present

## 2016-07-20 DIAGNOSIS — Z79899 Other long term (current) drug therapy: Secondary | ICD-10-CM

## 2016-07-20 DIAGNOSIS — Z136 Encounter for screening for cardiovascular disorders: Secondary | ICD-10-CM | POA: Diagnosis not present

## 2016-07-20 DIAGNOSIS — R6889 Other general symptoms and signs: Secondary | ICD-10-CM | POA: Diagnosis not present

## 2016-07-20 DIAGNOSIS — N898 Other specified noninflammatory disorders of vagina: Secondary | ICD-10-CM

## 2016-07-20 DIAGNOSIS — N760 Acute vaginitis: Secondary | ICD-10-CM

## 2016-07-20 DIAGNOSIS — I10 Essential (primary) hypertension: Secondary | ICD-10-CM | POA: Diagnosis not present

## 2016-07-20 DIAGNOSIS — R102 Pelvic and perineal pain: Secondary | ICD-10-CM | POA: Diagnosis not present

## 2016-07-20 LAB — CBC WITH DIFFERENTIAL/PLATELET
BASOS ABS: 63 {cells}/uL (ref 0–200)
Basophils Relative: 1 %
EOS PCT: 5 %
Eosinophils Absolute: 315 cells/uL (ref 15–500)
HCT: 39.7 % (ref 35.0–45.0)
Hemoglobin: 13.2 g/dL (ref 11.7–15.5)
Lymphocytes Relative: 47 %
Lymphs Abs: 2961 cells/uL (ref 850–3900)
MCH: 31.1 pg (ref 27.0–33.0)
MCHC: 33.2 g/dL (ref 32.0–36.0)
MCV: 93.6 fL (ref 80.0–100.0)
MONOS PCT: 6 %
MPV: 9.6 fL (ref 7.5–12.5)
Monocytes Absolute: 378 cells/uL (ref 200–950)
Neutro Abs: 2583 cells/uL (ref 1500–7800)
Neutrophils Relative %: 41 %
PLATELETS: 283 10*3/uL (ref 140–400)
RBC: 4.24 MIL/uL (ref 3.80–5.10)
RDW: 14.9 % (ref 11.0–15.0)
WBC: 6.3 10*3/uL (ref 3.8–10.8)

## 2016-07-20 LAB — BASIC METABOLIC PANEL WITH GFR
BUN: 15 mg/dL (ref 7–25)
CALCIUM: 9.5 mg/dL (ref 8.6–10.4)
CO2: 30 mmol/L (ref 20–31)
CREATININE: 0.92 mg/dL (ref 0.60–0.93)
Chloride: 99 mmol/L (ref 98–110)
GFR, Est African American: 69 mL/min (ref >=60)
GFR, Est Non African American: 60 mL/min (ref >=60)
Glucose, Bld: 84 mg/dL (ref 65–99)
Potassium: 4 mmol/L (ref 3.5–5.3)
SODIUM: 138 mmol/L (ref 135–146)

## 2016-07-20 LAB — HEPATIC FUNCTION PANEL
ALBUMIN: 3.9 g/dL (ref 3.6–5.1)
ALT: 19 U/L (ref 6–29)
AST: 17 U/L (ref 10–35)
Alkaline Phosphatase: 77 U/L (ref 33–130)
BILIRUBIN DIRECT: 0.1 mg/dL (ref <=0.2)
BILIRUBIN TOTAL: 0.5 mg/dL (ref 0.2–1.2)
Indirect Bilirubin: 0.4 mg/dL (ref 0.2–1.2)
Total Protein: 6.8 g/dL (ref 6.1–8.1)

## 2016-07-20 LAB — LIPID PANEL
Cholesterol: 181 mg/dL (ref <200)
HDL: 54 mg/dL (ref >50)
LDL Cholesterol: 107 mg/dL — ABNORMAL HIGH (ref <100)
Total CHOL/HDL Ratio: 3.4 Ratio (ref <5.0)
Triglycerides: 98 mg/dL (ref <150)
VLDL: 20 mg/dL (ref <30)

## 2016-07-20 LAB — TSH: TSH: 1.12 m[IU]/L

## 2016-07-20 MED ORDER — TRIAMCINOLONE ACETONIDE 0.5 % EX CREA: 1.0000 "application " | TOPICAL_CREAM | Freq: Two times a day (BID) | CUTANEOUS | 2 refills | Status: AC

## 2016-07-20 NOTE — Progress Notes (Signed)
Complete Physical  Assessment and Plan:  Essential hypertension - continue medications, DASH diet, exercise and monitor at home. Call if greater than 130/80.  - CBC with Differential - Hepatic function panel - Urinalysis, Routine w reflex microscopic - Microalbumin / creatinine urine ratio - EKG 12-Lead   Hypothyroidism, unspecified hypothyroidism type Hypothyroidism-check TSH level, continue medications the same, reminded to take on an empty stomach 30-49mins before food.  - TSH  Hyperlipidemia -continue medications, check lipids, decrease fatty foods, increase activity.  - Lipid panel  Prediabetes Discussed general issues about diabetes pathophysiology and management., Educational material distributed., Suggested low cholesterol diet., Encouraged aerobic exercise., Discussed foot care., Reminded to get yearly retinal exam. - Hemoglobin A1c - Insulin, fasting  CKD (chronic kidney disease) stage 2, GFR 60-89 ml/min Increase fluids, avoid NSAIDS, monitor sugars, will monitor - BASIC METABOLIC PANEL WITH GFR   Rheumatoid arthritis Send LFTs RA doctor  Vitamin D deficiency - Vit D  25 hydroxy (rtn osteoporosis monitoring)   Medication management - Magnesium  Allergy, sequela - montelukast (SINGULAIR) 10 MG tablet; Take 1 tablet (10 mg total) by mouth daily.  Dispense: 30 tablet; Refill: 2  Chronic obstructive pulmonary disease, unspecified COPD type (HCC) Avoid triggers, UTD on vaccines follow up as needed  Morbid Obesity with co morbidities - long discussion about weight loss, diet, and exercise -     Lipid panel -     Hemoglobin A1c  Depression, major, in remission (HCC) Continue medications  Vaginitis and vulvovaginitis -     WET PREP BY MOLECULAR PROBE -     triamcinolone cream (KENALOG) 0.5 %; Apply 1 application topically 2 (two) times daily. - may need medication for yeast however will wait for wet prep - possible vaginal atrophy but will get [place] first before  prescribing.   Vaginal mass -     US Pelvis Complete; Future -     US Transvaginal Non-OB; Future - + prolapse with nontender 4 cm hard pass palpated, rule out cancer - if + mass will refer to GYN/ONC, if just prolapse will refer to regular GYN for possible pessary   Discussed med's effects and SE's. Screening labs and tests as requested with regular follow-up as recommended.  Future Appointments Date Time Provider Department Center  07/24/2017 10:00 AM Quentin Mulling, PA-C [facility] None     HPI  76 y.o. female  presents for a complete physical.  She complains of vaginal itching, has tried vaseline, vagisil cream that does not help, warm water helps x 1 year, no discharge, no odor, no dysuria. Complains of dryness.   Her blood pressure has been controlled at home, today their BP is BP: 120/78 She does not workout but does yard work She denies chest pain, shortness of breath, dizziness.  She was a former smoker, has COPD. She has advair and last visit added singulair which has helped, does not use albuterol. She denies orthopnea, PND, edema. She is on methotrexate, 8 on Friday. Last CXR was 05/2014, has never had PFTS. She has RA, follows with Dr. Emmit Pomfret.  She is not on cholesterol medication and denies myalgias. Her cholesterol is at goal. The cholesterol last visit was:   Lab Results  Component Value Date   CHOL 179 04/18/2016   HDL 50 (L) 04/18/2016   LDLCALC 107 (H) 04/18/2016   TRIG 112 04/18/2016   CHOLHDL 3.6 04/18/2016   She has been working on diet and exercise for prediabetes, and denies paresthesia of the feet, polydipsia,  polyuria and visual disturbances. Last A1C in the office was:  Lab Results  Component Value Date   HGBA1C 5.8 (H) 04/18/2016  Patient is on Vitamin D supplement.   Lab Results  Component Value Date   VD25OH 56 01/03/2016    She is back on celexa, which helps with mood.  She is on thyroid medication. Her medication was changed last visit, it was  decreased to one pill daily.  Lab Results  Component Value Date   TSH 3.26 04/18/2016    BMI is Body mass index is 32.64 kg/m., she is working on diet and exercise. Wt Readings from Last 3 Encounters:  07/20/16 202 lb 3.2 oz (91.7 kg)  04/18/16 197 lb (89.4 kg)  01/03/16 201 lb 9.6 oz (91.4 kg)   Current Medications:  Current Outpatient Prescriptions on File Prior to Visit  Medication Sig Dispense Refill  . Adalimumab (HUMIRA PEN) 40 MG/0.8ML PNKT     . aspirin EC 81 MG tablet Take 81 mg by mouth.    . calcium carbonate (OS-CAL) 600 MG TABS tablet Take 600 mg by mouth 2 (two) times daily with a meal.    . Cholecalciferol (VITAMIN D3) 2000 units capsule Take by mouth.    . citalopram (CELEXA) 40 MG tablet take 1 tablet by mouth once daily 90 tablet 1  . Fluticasone-Salmeterol (ADVAIR DISKUS) 100-50 MCG/DOSE AEPB Inhale 1 dose by mouth twice a day. 60 each 1  . folic acid (FOLVITE) 1 MG tablet Take by mouth daily.    Marland Kitchen glucose blood (FREESTYLE LITE) test strip Test Blood Sugar one time daily 100 each prn  . hydrochlorothiazide (HYDRODIURIL) 25 MG tablet take 1 tablet by mouth once daily for blood pressure and FLUID RETENTION 90 tablet 1  . levothyroxine (SYNTHROID, LEVOTHROID) 112 MCG tablet take 1 tablet by mouth once daily BEFORE BREAKFAST 90 tablet 1  . losartan (COZAAR) 100 MG tablet take 1 tablet by mouth once daily 90 tablet 2  . Magnesium 400 MG CAPS Take 400 mg by mouth 2 (two) times daily.    . methotrexate (RHEUMATREX) 2.5 MG tablet Take by mouth. Takes 8 pills on Fridays    . montelukast (SINGULAIR) 10 MG tablet take 1 tablet by mouth once daily 90 tablet 1  . Omega-3 Fatty Acids (FISH OIL PO) Take by mouth 2 (two) times daily.      No current facility-administered medications on file prior to visit.    Health Maintenance:   Immunization History  Administered Date(s) Administered  . DT 06/22/2015  . Influenza, High Dose Seasonal PF 02/03/2014, 02/14/2015  .  Influenza-Unspecified 03/02/2016  . Pneumococcal Conjugate-13 07/16/2014  . Pneumococcal Polysaccharide-23 05/30/2003, 09/30/2015  . Td 05/29/2005  . Zoster 05/29/2006   TD: 2017 Pneumovax: 2017 Prevnar: 2016 Flu vaccine: 2017 Zostavax: 2008   Pap: 2012 neg  MGM:  02/2015 negative DEXA: 2013 nl Declines Colonoscopy: 2010 + tics due 2020  EGD: N/A Echo 01/2010 45% to 50% with mild MR Last Dental Exam: Dr. Weston Anna Last Eye Exam: Dr. August Luz  Patient Care Team: Lucky Cowboy, MD as PCP - General (Internal Medicine) Lurena Nida, MD as Consulting Physician (Rheumatology) Shea Evans, MD as Consulting Physician (Obstetrics and Gynecology) Bernette Redbird, MD as Consulting Physician (Gastroenterology) Ronney Asters May, MD as Referring Physician (Otolaryngology) Berneice Gandy, MD as Referring Physician (Dermatology)  Medical History:  Past Medical History:  Diagnosis Date  . Allergy   . Hyperlipidemia   . Hypertension   . Prediabetes   .  Rheumatoid arthritis (HCC)   . Thyroid disease   . Vitamin D deficiency    Allergies Allergies  Allergen Reactions  . Ace Inhibitors Cough    Other reaction(s): Other (See Comments) Made her cough  . Pneumococcal Vaccine     Other reaction(s): Other (See Comments) Caused arm to swell    SURGICAL HISTORY She  has a past surgical history that includes Cochlear implant (Right, 2007); Appendectomy; Tubal ligation (Bilateral); Cholecystectomy; Other surgical history; Ankle arthroscopy with open reduction internal fixation (orif) (Right, 2004); and Other surgical history. FAMILY HISTORY Her family history includes Heart attack in her mother; Hypertension in her mother; Lymphoma in her mother. SOCIAL HISTORY She  reports that she quit smoking about 27 years ago. She has never used smokeless tobacco. She reports that she does not drink alcohol or use drugs.  Review of Systems: Review of Systems  Constitutional: Negative.    HENT: Positive for hearing loss. Negative for congestion, ear discharge, ear pain, nosebleeds, sore throat and tinnitus.   Eyes: Negative.   Respiratory: Negative for cough, hemoptysis, sputum production, shortness of breath, wheezing and stridor.   Cardiovascular: Negative.  Negative for chest pain, palpitations and leg swelling.  Gastrointestinal: Positive for heartburn (has been trying to get off prilosec). Negative for abdominal pain, blood in stool, constipation, diarrhea, melena, nausea and vomiting.  Genitourinary: Negative.  Negative for dysuria, flank pain, frequency, hematuria and urgency.       Vaginal itching, no discharge.   Musculoskeletal: Positive for myalgias. Negative for back pain, falls, joint pain and neck pain.  Skin: Negative.   Neurological: Negative.  Negative for headaches.  Psychiatric/Behavioral: Negative for depression, hallucinations, memory loss, substance abuse and suicidal ideas. The patient is not nervous/anxious and does not have insomnia.     Physical Exam: Estimated body mass index is 32.64 kg/m as calculated from the following:   Height as of 04/18/16: 5\' 6"  (1.676 m).   Weight as of this encounter: 202 lb 3.2 oz (91.7 kg). BP 120/78   Pulse 82   Temp 97.9 F (36.6 C)   Resp 16   Wt 202 lb 3.2 oz (91.7 kg)   SpO2 97%   BMI 32.64 kg/m  General Appearance: Well nourished, in no apparent distress.  Eyes: PERRLA, EOMs, conjunctiva no swelling or erythema, normal fundi and vessels.  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 Tonsils not swollen or erythematous. Hearing impaired, has hearing aids Neck: Supple, thyroid normal. No bruits  Respiratory: Respiratory effort normal,  without wheezing, rales, rhonchi,or stridor.  Cardio: RRR without murmurs, rubs or gallops. Brisk  peripheral pulses without edema.  Chest: symmetric, with normal excursions and percussion.   Breasts: breasts appear normal, no suspicious masses, no skin or nipple changes or axillary nodes. Abdomen: Soft, + epigastric tender, no guarding, rebound, hernias, masses, or organomegaly. .  Lymphatics: Non tender without lymphadenopathy.  Genitourinary: VULVA: vulvar edema, erythema, hypopigmentation , VAGINA: atrophic, PELVIC FLOOR EXAM: vaginal prolapse with 4 cm hard nodule/mass palpated, vaginal edema.  Musculoskeletal: Full ROM all peripheral extremities,5/5 strength, and normal gait.  Skin: several seb derm keratosis Warm, dry without rashes, lesions, ecchymosis. Neuro: Cranial nerves intact, reflexes equal bilaterally. Normal muscle tone, no cerebellar symptoms. Sensation intact.  Psych: Awake and oriented X 3, normal affect, Insight and Judgment appropriate.   EKG: WNL,  no ST changes. AORTA SCAN: defer   Quentin Mulling 10:05 AM [facility]

## 2016-07-20 NOTE — Patient Instructions (Signed)
Vaginitis Vaginitis is an inflammation of the vagina. It can happen when the normal bacteria and yeast in the vagina grow too much. There are different types. Treatment will depend on the type you have. Follow these instructions at home:  Take all medicines as told by your doctor.  Keep your vagina area clean and dry. Avoid soap. Rinse the area with water.  Avoid washing and cleaning out the vagina (douching).  Do not use tampons or have sex (intercourse) until your treatment is done.  Wipe from front to back after going to the restroom.  Wear cotton underwear.  Avoid wearing underwear while you sleep until your vaginitis is gone.  Avoid tight pants. Avoid underwear or nylons without a cotton panel.  Take off wet clothing (such as a bathing suit) as soon as you can.  Use mild, unscented products. Avoid fabric softeners and scented:  Feminine sprays.  Laundry detergents.  Tampons.  Soaps or bubble baths.  Practice safe sex and use condoms. Get help right away if:  You have belly (abdominal) pain.  You have a fever or lasting symptoms for more than 2-3 days.  You have a fever and your symptoms suddenly get worse. This information is not intended to replace advice given to you by your health care provider. Make sure you discuss any questions you have with your health care provider. Document Released: 08/11/2008 Document Revised: 10/21/2015 Document Reviewed: 10/26/2011 Elsevier Interactive Patient Education  2017 [employer]   Atrophic Vaginitis Introduction Atrophic vaginitis is when the tissues that line the vagina become dry and thin. This is caused by a drop in estrogen. Estrogen helps:  To keep the vagina moist.  To make a clear fluid that helps:  To lubricate the vagina for sex.  To protect the vagina from infection. If the lining of the vagina is dry and thin, it may:  Make sex painful. It may also cause bleeding.  Cause a feeling  of:  Burning.  Irritation.  Itchiness.  Make an exam of your vagina painful. It may also cause bleeding.  Make you lose interest in sex.  Cause a burning feeling when you pee.  Make your vaginal fluid (discharge) brown or yellow. For some women, there are no symptoms. This condition is most common in women who do not get their regular menstrual periods anymore (menopause). This often starts when a woman is 10-57 years old. Follow these instructions at home:  Take medicines only as told by your doctor. Do not use any herbal or alternative medicines unless your doctor says it is okay.  Use over-the-counter products for dryness only as told by your doctor. These include:  Creams.  Lubricants.  Moisturizers.  Do not douche.  Do not use products that can make your vagina dry. These include:  Scented feminine sprays.  Scented tampons.  Scented soaps.  If it hurts to have sex, tell your sexual partner. Contact a doctor if:  Your discharge looks different than normal.  Your vagina has an unusual smell.  You have new symptoms.  Your symptoms do not get better with treatment.  Your symptoms get worse. This information is not intended to replace advice given to you by your health care provider. Make sure you discuss any questions you have with your health care provider. Document Released: 11/01/2007 Document Revised: 10/21/2015 Document Reviewed: 05/06/2014  2017 Elsevier

## 2016-07-21 LAB — URINALYSIS, ROUTINE W REFLEX MICROSCOPIC
BILIRUBIN URINE: NEGATIVE
Glucose, UA: NEGATIVE
Hgb urine dipstick: NEGATIVE
Ketones, ur: NEGATIVE
Leukocytes, UA: NEGATIVE
Nitrite: NEGATIVE
PROTEIN: NEGATIVE
Specific Gravity, Urine: 1.013 (ref 1.001–1.035)
pH: 7 (ref 5.0–8.0)

## 2016-07-21 LAB — MICROALBUMIN / CREATININE URINE RATIO
Creatinine, Urine: 89 mg/dL (ref 20–320)
Microalb Creat Ratio: 2 mcg/mg creat (ref <30)
Microalb, Ur: 0.2 mg/dL

## 2016-07-21 LAB — MAGNESIUM: Magnesium: 1.8 mg/dL (ref 1.5–2.5)

## 2016-07-21 LAB — WET PREP BY MOLECULAR PROBE
CANDIDA SPECIES: NEGATIVE
Gardnerella vaginalis: NEGATIVE
TRICHOMONAS VAG: NEGATIVE

## 2016-07-21 LAB — VITAMIN D 25 HYDROXY (VIT D DEFICIENCY, FRACTURES): VIT D 25 HYDROXY: 58 ng/mL (ref 30–100)

## 2016-07-21 LAB — HEMOGLOBIN A1C
HEMOGLOBIN A1C: 5.8 % — AB (ref <5.7)
Mean Plasma Glucose: 120 mg/dL

## 2016-08-28 ENCOUNTER — Other Ambulatory Visit: Payer: Self-pay | Admitting: *Deleted

## 2016-08-28 MED ORDER — HYDROCHLOROTHIAZIDE 25 MG PO TABS: ORAL_TABLET | ORAL | 1 refills | Status: DC

## 2016-09-25 ENCOUNTER — Other Ambulatory Visit: Payer: Self-pay | Admitting: *Deleted

## 2016-09-25 DIAGNOSIS — R05 Cough: Secondary | ICD-10-CM

## 2016-09-25 DIAGNOSIS — T7840XS Allergy, unspecified, sequela: Secondary | ICD-10-CM

## 2016-09-25 DIAGNOSIS — R059 Cough, unspecified: Secondary | ICD-10-CM

## 2016-09-25 MED ORDER — MONTELUKAST SODIUM 10 MG PO TABS: 10.0000 mg | ORAL_TABLET | Freq: Every day | ORAL | 1 refills | Status: DC

## 2016-10-20 ENCOUNTER — Ambulatory Visit: Payer: Self-pay | Admitting: Internal Medicine

## 2016-10-29 NOTE — Progress Notes (Signed)
3 month follow up and medicare wellness  Assessment and Plan:  Essential hypertension - continue medications, DASH diet, exercise and monitor at home. Call if greater than 130/80.    Hypothyroidism, unspecified hypothyroidism type Hypothyroidism-check TSH level, continue medications the same, reminded to take on an empty stomach 30-42mins before food.    Hyperlipidemia -continue medications, check lipids, decrease fatty foods, increase activity.    Prediabetes Discussed general issues about diabetes pathophysiology and management., Educational material distributed., Suggested low cholesterol diet., Encouraged aerobic exercise., Discussed foot care., Reminded to get yearly retinal exam.  CKD (chronic kidney disease) stage 2, GFR 60-89 ml/min Increase fluids, avoid NSAIDS, monitor sugars, will monitor   Rheumatoid arthritis Send LFTs RA doctor   Vitamin D deficiency   Medication management   Morbid obesity, unspecified obesity type (HCC) Obesity with co morbidities- long discussion about weight loss, diet, and exercise   Depression, major, in remission (HCC) Continue celexa, negative screen   Chronic obstructive pulmonary disease, unspecified COPD type (HCC) Continue medications   Hearing loss, mixed, bilateral Has hearing aids.    Discussed med's effects and SE's. Screening labs and tests as requested with regular follow-up as recommended.  Future Appointments Date Time Provider Department Center  01/31/2017 10:30 AM Lucky Cowboy, MD [facility] None  07/24/2017 10:00 AM Quentin Mulling, PA-C [facility] None    During the course of the visit the patient was educated and counseled about appropriate screening and preventive services including:    Pneumococcal vaccine   Influenza vaccine  Td vaccine  Screening electrocardiogram  Bone densitometry screening  Colorectal cancer screening  Diabetes screening  Glaucoma screening  Nutrition counseling    Advanced directives: requested   HPI  76 y.o. female  presents for follow up with COPD, RA, chol, preDM and wellness visit.   She has left knee pain, going to see ortho tomorrow.   Her blood pressure has been controlled at home, today their BP is BP: 122/80 She does not workout but does yard work She denies chest pain, shortness of breath, dizziness.  She had endometrial thickening on [place] and went to GYN, had sampling of uterus, and wants a repeat in 6 months., will try to get results.  She was a former smoker, has COPD. She has advair and last visit added singulair which has helped, does not use albuterol. She denies orthopnea, PND, edema. She is on methotrexate, 8 on Friday. Last CXR was 05/2014, has never had PFTS.  She is not on cholesterol medication and denies myalgias. Her cholesterol is at goal. The cholesterol last visit was:   Lab Results  Component Value Date   CHOL 181 07/20/2016   HDL 54 07/20/2016   LDLCALC 107 (H) 07/20/2016   TRIG 98 07/20/2016   CHOLHDL 3.4 07/20/2016   She has been working on diet and exercise for prediabetes, and denies paresthesia of the feet, polydipsia, polyuria and visual disturbances. Last A1C in the office was:  Lab Results  Component Value Date   HGBA1C 5.8 (H) 07/20/2016  Patient is on Vitamin D supplement.   Lab Results  Component Value Date   VD25OH 58 07/20/2016   She has RA, follows with Dr. Emmit Pomfret.  She is back on celexa, which helps with mood.  She is on thyroid medication. Her medication was changed last visit, it was decreased to one pill daily.  Lab Results  Component Value Date   TSH 1.12 07/20/2016   BMI is Body mass index is 31.89 kg/m., she  is working on diet and exercise. Wt Readings from Last 3 Encounters:  10/31/16 197 lb 9.6 oz (89.6 kg)  07/20/16 202 lb 3.2 oz (91.7 kg)  04/18/16 197 lb (89.4 kg)    Current Medications:  Current Outpatient Prescriptions on File Prior to Visit  Medication Sig Dispense Refill  .  Adalimumab (HUMIRA PEN) 40 MG/0.8ML PNKT     . aspirin EC 81 MG tablet Take 81 mg by mouth.    . calcium carbonate (OS-CAL) 600 MG TABS tablet Take 600 mg by mouth 2 (two) times daily with a meal.    . Cholecalciferol (VITAMIN D3) 2000 units capsule Take by mouth.    . citalopram (CELEXA) 40 MG tablet take 1 tablet by mouth once daily 90 tablet 1  . folic acid (FOLVITE) 1 MG tablet Take by mouth daily.    Marland Kitchen glucose blood (FREESTYLE LITE) test strip Test Blood Sugar one time daily 100 each prn  . hydrochlorothiazide (HYDRODIURIL) 25 MG tablet take 1 tablet by mouth once daily for blood pressure and FLUID RETENTION 90 tablet 1  . levothyroxine (SYNTHROID, LEVOTHROID) 112 MCG tablet take 1 tablet by mouth once daily BEFORE BREAKFAST 90 tablet 1  . losartan (COZAAR) 100 MG tablet take 1 tablet by mouth once daily 90 tablet 2  . Magnesium 400 MG CAPS Take 400 mg by mouth 2 (two) times daily.    . methotrexate (RHEUMATREX) 2.5 MG tablet Take by mouth. Takes 8 pills on Fridays    . montelukast (SINGULAIR) 10 MG tablet Take 1 tablet (10 mg total) by mouth daily. 90 tablet 1  . Omega-3 Fatty Acids (FISH OIL PO) Take by mouth 2 (two) times daily.     Marland Kitchen triamcinolone cream (KENALOG) 0.5 % Apply 1 application topically 2 (two) times daily. 80 g 2   No current facility-administered medications on file prior to visit.    Health Maintenance:   Immunization History  Administered Date(s) Administered  . DT 06/22/2015  . Influenza, High Dose Seasonal PF 02/03/2014, 02/14/2015  . Influenza-Unspecified 03/02/2016  . Pneumococcal Conjugate-13 07/16/2014  . Pneumococcal Polysaccharide-23 05/30/2003, 09/30/2015  . Td 05/29/2005  . Zoster 05/29/2006   Tetanus: 2017 Pneumovax: 2005 Prevnar: 2016 Flu vaccine: 2016 Zostavax: 2008   Pap: 2012 neg  MGM:  02/2015 due in 02/2017 DEXA: 2013 nl Declines Colonoscopy: 2010 + tics due 2020  EGD: N/A Echo 01/2010 45% to 50% with mild MR Last Dental Exam: Dr.  Weston Anna Last Eye Exam: Dr. August Luz, Jan 2018  Patient Care Team: Lucky Cowboy, MD as PCP - General (Internal Medicine) Shea Evans, MD as Consulting Physician (Obstetrics and Gynecology) Bernette Redbird, MD as Consulting Physician (Gastroenterology) May, Ronney Asters, MD as Referring Physician (Otolaryngology) Berneice Gandy, MD as Referring Physician (Dermatology) Rossie Muskrat, MD as Consulting Physician (Rheumatology)  Medical History:  Past Medical History:  Diagnosis Date  . Allergy   . Hyperlipidemia   . Hypertension   . Prediabetes   . Rheumatoid arthritis (HCC)   . Thyroid disease   . Vitamin D deficiency    Allergies Allergies  Allergen Reactions  . Ace Inhibitors Cough    Other reaction(s): Other (See Comments) Made her cough  . Pneumococcal Vaccine     Other reaction(s): Other (See Comments) Caused arm to swell    SURGICAL HISTORY She  has a past surgical history that includes Cochlear implant (Right, 2007); Appendectomy; Tubal ligation (Bilateral); Cholecystectomy; Other surgical history; Ankle arthroscopy with open reduction internal fixation (  orif) (Right, 2004); and Other surgical history. FAMILY HISTORY Her family history includes Heart attack in her mother; Hypertension in her mother; Lymphoma in her mother. SOCIAL HISTORY She  reports that she quit smoking about 28 years ago. She has never used smokeless tobacco. She reports that she does not drink alcohol or use drugs.  MEDICARE WELLNESS OBJECTIVES: Physical activity: Current Exercise Habits: The patient does not participate in regular exercise at present, Exercise limited by: orthopedic condition(s) Cardiac risk factors: Cardiac Risk Factors include: advanced age (>64men, >76 women);dyslipidemia;hypertension;obesity (BMI >30kg/m2);sedentary lifestyle Depression/mood screen:   Depression screen [facility] 2/9 10/31/2016  Decreased Interest 0  Down, Depressed, Hopeless 0  PHQ - 2 Score 0     ADLs:  In your present state of health, do you have any difficulty performing the following activities: 10/31/2016 01/03/2016  Hearing? N N  Vision? N N  Difficulty concentrating or making decisions? N N  Walking or climbing stairs? N N  Dressing or bathing? N N  Doing errands, shopping? N N  Some recent data might be hidden    Cognitive Testing  Alert? Yes  Normal Appearance?Yes  Oriented to person? Yes  Place? Yes   Time? Yes  Recall of three objects?  Yes  Can perform simple calculations? Yes  Displays appropriate judgment?Yes  Can read the correct time from a watch face?Yes  EOL planning: Does Patient Have a Medical Advance Directive?: Yes Type of Advance Directive: Healthcare Power of Attorney, Living will Copy of Healthcare Power of Attorney in Chart?: No - copy requested   Review of Systems: Review of Systems  Constitutional: Negative.   HENT: Positive for hearing loss. Negative for congestion, ear discharge, ear pain, nosebleeds, sore throat and tinnitus.   Eyes: Negative.   Respiratory: Negative for cough, hemoptysis, sputum production, shortness of breath, wheezing and stridor.   Cardiovascular: Negative.  Negative for chest pain, palpitations and leg swelling.  Gastrointestinal: Positive for heartburn (has been trying to get off prilosec). Negative for abdominal pain, blood in stool, constipation, diarrhea, melena, nausea and vomiting.  Genitourinary: Negative.   Musculoskeletal: Positive for myalgias (right foot). Negative for back pain, falls, joint pain and neck pain.  Skin: Negative.   Neurological: Negative.  Negative for headaches.  Psychiatric/Behavioral: Negative for depression, hallucinations, memory loss, substance abuse and suicidal ideas. The patient is not nervous/anxious and does not have insomnia.      Physical Exam: Estimated body mass index is 31.89 kg/m as calculated from the following:   Height as of this encounter: 5\' 6"  (1.676 m).   Weight as  of this encounter: 197 lb 9.6 oz (89.6 kg). BP 122/80   Pulse 61   Temp 97.5 F (36.4 C)   Resp 14   Ht 5\' 6"  (1.676 m)   Wt 197 lb 9.6 oz (89.6 kg)   SpO2 95%   BMI 31.89 kg/m  General Appearance: Well nourished, in no apparent distress.  Eyes: PERRLA, EOMs, conjunctiva no swelling or erythema, normal fundi and vessels.  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 Tonsils not swollen or erythematous. Hearing impaired, has hearing aids Neck: Supple, thyroid normal. No bruits  Respiratory: Respiratory effort normal,  without wheezing, rales, rhonchi,or stridor.  Cardio: RRR without murmurs, rubs or gallops. Brisk peripheral pulses without edema.  Chest: symmetric, with normal excursions and percussion.  Breasts: defer Abdomen: Soft, nontender,  no guarding, rebound, hernias, masses, or organomegaly. .  Lymphatics: Non tender without lymphadenopathy.  Genitourinary: defer Musculoskeletal: Full ROM all peripheral extremities,5/5 strength, and normal antalgic Skin: several seb derm keratosis, skin tag left axilla. Warm, dry without rashes, lesions, ecchymosis. Neuro: Cranial nerves intact, reflexes equal bilaterally. Normal muscle tone, no cerebellar symptoms. Sensation intact.  Psych: Awake and oriented X 3, normal affect, Insight and Judgment appropriate.   Medicare Attestation I have personally reviewed: The patient's medical and social history Their use of alcohol, tobacco or illicit drugs Their current medications and supplements The patient's functional ability including ADLs,fall risks, home safety risks, cognitive, and hearing and visual impairment Diet and physical activities Evidence for depression or mood disorders  The patient's weight, height, BMI, and visual acuity have been recorded in the chart.   I have made referrals, counseling, and provided education to the patient based on review of the above and I have provided the patient with a written personalized care plan for preventive services.  Over 40 minutes of exam, counseling, chart review was performed.  Quentin Mulling 12:50 PM [facility]

## 2016-10-31 ENCOUNTER — Ambulatory Visit (INDEPENDENT_AMBULATORY_CARE_PROVIDER_SITE_OTHER): Payer: Medicare Other | Admitting: Physician Assistant

## 2016-10-31 ENCOUNTER — Encounter: Payer: Self-pay | Admitting: Physician Assistant

## 2016-10-31 VITALS — BP 122/80 | HR 61 | Temp 97.5°F | Resp 14 | Ht 66.0 in | Wt 197.6 lb

## 2016-10-31 DIAGNOSIS — R7309 Other abnormal glucose: Secondary | ICD-10-CM | POA: Diagnosis not present

## 2016-10-31 DIAGNOSIS — M069 Rheumatoid arthritis, unspecified: Secondary | ICD-10-CM | POA: Diagnosis not present

## 2016-10-31 DIAGNOSIS — Z79899 Other long term (current) drug therapy: Secondary | ICD-10-CM

## 2016-10-31 DIAGNOSIS — N182 Chronic kidney disease, stage 2 (mild): Secondary | ICD-10-CM | POA: Diagnosis not present

## 2016-10-31 DIAGNOSIS — J449 Chronic obstructive pulmonary disease, unspecified: Secondary | ICD-10-CM

## 2016-10-31 DIAGNOSIS — E559 Vitamin D deficiency, unspecified: Secondary | ICD-10-CM

## 2016-10-31 DIAGNOSIS — I10 Essential (primary) hypertension: Secondary | ICD-10-CM

## 2016-10-31 DIAGNOSIS — Z0000 Encounter for general adult medical examination without abnormal findings: Secondary | ICD-10-CM

## 2016-10-31 DIAGNOSIS — E039 Hypothyroidism, unspecified: Secondary | ICD-10-CM | POA: Diagnosis not present

## 2016-10-31 DIAGNOSIS — Z0001 Encounter for general adult medical examination with abnormal findings: Secondary | ICD-10-CM

## 2016-10-31 DIAGNOSIS — R6889 Other general symptoms and signs: Secondary | ICD-10-CM | POA: Diagnosis not present

## 2016-10-31 DIAGNOSIS — E782 Mixed hyperlipidemia: Secondary | ICD-10-CM | POA: Diagnosis not present

## 2016-10-31 DIAGNOSIS — F325 Major depressive disorder, single episode, in full remission: Secondary | ICD-10-CM | POA: Diagnosis not present

## 2016-10-31 LAB — CBC WITH DIFFERENTIAL/PLATELET
BASOS PCT: 1 %
Basophils Absolute: 67 cells/uL (ref 0–200)
EOS ABS: 402 {cells}/uL (ref 15–500)
Eosinophils Relative: 6 %
HEMATOCRIT: 41.6 % (ref 35.0–45.0)
Hemoglobin: 13.9 g/dL (ref 11.7–15.5)
Lymphocytes Relative: 46 %
Lymphs Abs: 3082 cells/uL (ref 850–3900)
MCH: 31.2 pg (ref 27.0–33.0)
MCHC: 33.4 g/dL (ref 32.0–36.0)
MCV: 93.3 fL (ref 80.0–100.0)
MONO ABS: 402 {cells}/uL (ref 200–950)
MPV: 10.1 fL (ref 7.5–12.5)
Monocytes Relative: 6 %
NEUTROS ABS: 2747 {cells}/uL (ref 1500–7800)
Neutrophils Relative %: 41 %
PLATELETS: 286 10*3/uL (ref 140–400)
RBC: 4.46 MIL/uL (ref 3.80–5.10)
RDW: 14.5 % (ref 11.0–15.0)
WBC: 6.7 10*3/uL (ref 3.8–10.8)

## 2016-10-31 NOTE — Patient Instructions (Signed)
Simple math prevails.    1st - exercise does not produce significant weight loss - at best one converts fat into muscle , "bulks up", loses inches, but usually stays "weight neutral"     2nd - think of your body weightas a check book: If you eat more calories than you burn up - you save money or gain weight .... Or if you spend more money than you put in the check book, ie burn up more calories than you eat, then you lose weight     3rd - if you walk or run 1 mile, you burn up 100 calories - you have to burn up 3,500 calories to lose 1 pound, ie you have to walk/run 35 miles to lose 1 measly pound. So if you want to lose 10 #, then you have to walk/run 350 miles, so.... clearly exercise is not the solution.     4. So if you consume 1,500 calories, then you have to burn up the equivalent of 15 miles to stay weight neutral - It also stands to reason that if you consume 1,500 cal/day and don't lose weight, then you must be burning up about 1,500 cals/day to stay weight neutral.     5. If you really want to lose weight, you must cut your calorie intake 300 calories /day and at that rate you should lose about 1 # every 3 days.   6. Please purchase Dr Francis Dowse Fuhrman's book(s) "The End of Dieting" & "Eat to Live" . It has some great concepts and recipes.       Food Choices for Gastroesophageal Reflux Disease, Adult When you have gastroesophageal reflux disease (GERD), the foods you eat and your eating habits are very important. Choosing the right foods can help ease your discomfort. What guidelines do I need to follow?  Choose fruits, vegetables, whole grains, and low-fat dairy products.  Choose low-fat meat, fish, and poultry.  Limit fats such as oils, salad dressings, butter, nuts, and avocado.  Keep a food diary. This helps you identify foods that cause symptoms.  Avoid foods that cause symptoms. These may be different for everyone.  Eat small meals often instead of 3 large meals a  day.  Eat your meals slowly, in a place where you are relaxed.  Limit fried foods.  Cook foods using methods other than frying.  Avoid drinking alcohol.  Avoid drinking large amounts of liquids with your meals.  Avoid bending over or lying down until 2-3 hours after eating. What foods are not recommended? These are some foods and drinks that may make your symptoms worse: Vegetables Tomatoes. Tomato juice. Tomato and spaghetti sauce. Chili peppers. Onion and garlic. Horseradish. Fruits Oranges, grapefruit, and lemon (fruit and juice). Meats High-fat meats, fish, and poultry. This includes hot dogs, ribs, ham, sausage, salami, and bacon. Dairy Whole milk and chocolate milk. Sour cream. Cream. Butter. Ice cream. Cream cheese. Drinks Coffee and tea. Bubbly (carbonated) drinks or energy drinks. Condiments Hot sauce. Barbecue sauce. Sweets/Desserts Chocolate and cocoa. Donuts. Peppermint and spearmint. Fats and Oils High-fat foods. This includes [place] fries and potato chips. Other Vinegar. Strong spices. This includes black pepper, white pepper, red pepper, cayenne, curry powder, cloves, ginger, and chili powder. The items listed above may not be a complete list of foods and drinks to avoid. Contact your dietitian for more information. This information is not intended to replace advice given to you by your health care provider. Make sure you discuss any questions you  have with your health care provider. Document Released: 11/14/2011 Document Revised: 10/21/2015 Document Reviewed: 03/19/2013 Elsevier Interactive Patient Education  2017 [employer].

## 2016-11-01 LAB — BASIC METABOLIC PANEL WITH GFR
BUN: 14 mg/dL (ref 7–25)
CHLORIDE: 100 mmol/L (ref 98–110)
CO2: 27 mmol/L (ref 20–31)
Calcium: 9.8 mg/dL (ref 8.6–10.4)
Creat: 0.95 mg/dL — ABNORMAL HIGH (ref 0.60–0.93)
GFR, EST NON AFRICAN AMERICAN: 58 mL/min — AB (ref >=60)
GFR, Est African American: 67 mL/min (ref >=60)
GLUCOSE: 101 mg/dL — AB (ref 65–99)
POTASSIUM: 3.9 mmol/L (ref 3.5–5.3)
Sodium: 139 mmol/L (ref 135–146)

## 2016-11-01 LAB — HEPATIC FUNCTION PANEL
ALBUMIN: 4 g/dL (ref 3.6–5.1)
ALK PHOS: 82 U/L (ref 33–130)
ALT: 15 U/L (ref 6–29)
AST: 15 U/L (ref 10–35)
BILIRUBIN INDIRECT: 0.4 mg/dL (ref 0.2–1.2)
BILIRUBIN TOTAL: 0.5 mg/dL (ref 0.2–1.2)
Bilirubin, Direct: 0.1 mg/dL (ref <=0.2)
Total Protein: 6.9 g/dL (ref 6.1–8.1)

## 2016-11-01 LAB — LIPID PANEL
CHOL/HDL RATIO: 3.5 ratio (ref <5.0)
Cholesterol: 175 mg/dL (ref <200)
HDL: 50 mg/dL — AB (ref >50)
LDL CALC: 102 mg/dL — AB (ref <100)
TRIGLYCERIDES: 116 mg/dL (ref <150)
VLDL: 23 mg/dL (ref <30)

## 2016-11-01 LAB — HEMOGLOBIN A1C
HEMOGLOBIN A1C: 5.9 % — AB (ref <5.7)
Mean Plasma Glucose: 123 mg/dL

## 2016-11-01 LAB — MAGNESIUM: Magnesium: 1.9 mg/dL (ref 1.5–2.5)

## 2016-11-01 LAB — TSH: TSH: 2.72 mIU/L

## 2016-11-01 NOTE — Progress Notes (Signed)
LVM for pt to return office call for LAB results.

## 2016-11-02 NOTE — Progress Notes (Signed)
Pt aware of lab results & voiced understanding of those results.

## 2016-11-27 ENCOUNTER — Other Ambulatory Visit: Payer: Self-pay | Admitting: Internal Medicine

## 2016-12-04 ENCOUNTER — Other Ambulatory Visit: Payer: Self-pay | Admitting: Physician Assistant

## 2017-01-22 ENCOUNTER — Ambulatory Visit: Payer: Self-pay | Admitting: Internal Medicine

## 2017-01-31 ENCOUNTER — Ambulatory Visit (INDEPENDENT_AMBULATORY_CARE_PROVIDER_SITE_OTHER): Payer: Medicare Other | Admitting: Internal Medicine

## 2017-01-31 ENCOUNTER — Other Ambulatory Visit: Payer: Self-pay | Admitting: *Deleted

## 2017-01-31 ENCOUNTER — Encounter: Payer: Self-pay | Admitting: Internal Medicine

## 2017-01-31 VITALS — BP 114/76 | HR 84 | Temp 97.5°F | Resp 18 | Ht 66.0 in | Wt 195.4 lb

## 2017-01-31 DIAGNOSIS — E039 Hypothyroidism, unspecified: Secondary | ICD-10-CM

## 2017-01-31 DIAGNOSIS — I10 Essential (primary) hypertension: Secondary | ICD-10-CM | POA: Diagnosis not present

## 2017-01-31 DIAGNOSIS — E782 Mixed hyperlipidemia: Secondary | ICD-10-CM

## 2017-01-31 DIAGNOSIS — R7303 Prediabetes: Secondary | ICD-10-CM

## 2017-01-31 DIAGNOSIS — E559 Vitamin D deficiency, unspecified: Secondary | ICD-10-CM

## 2017-01-31 DIAGNOSIS — Z79899 Other long term (current) drug therapy: Secondary | ICD-10-CM

## 2017-01-31 MED ORDER — GLUCOSE BLOOD VI STRP: ORAL_STRIP | 4 refills | Status: DC

## 2017-01-31 NOTE — Patient Instructions (Signed)

## 2017-01-31 NOTE — Progress Notes (Signed)
This delightful L 76 y.o. WWF presents for 3 month follow up with Hypertension, Hyperlipidemia, Pre-Diabetes, Hypothyroidism, Rheumatoid Arthritis and Vitamin D Deficiency. Patient iss followed by Dr Kathi Ludwig for her RA. Patient also has a Rt Cochlear hearing Implant.      Patient is treated for HTN (1999) & BP has been controlled at home. Today's BP is at goal - 114/76. Patient has had no complaints of any cardiac type chest pain, palpitations, dyspnea/orthopnea/PND, dizziness, claudication, or dependent edema.     Hyperlipidemia is controlled with diet & meds. Patient denies myalgias or other med SE's. Last Lipids were near goal: Lab Results  Component Value Date   CHOL 175 10/31/2016   HDL 50 (L) 10/31/2016   LDLCALC 102 (H) 10/31/2016   TRIG 116 10/31/2016   CHOLHDL 3.5 10/31/2016      Also, the patient has history of Morbid Obesity (BMI 31+) and PreDiabetes and has had no symptoms of reactive hypoglycemia, diabetic polys, paresthesias or visual blurring.  Last A1c was not at goal:  Lab Results  Component Value Date   HGBA1C 5.9 (H) 10/31/2016     Patient has Hypothyroidism and has been on replacement circa 1990's.  Further, the patient also has history of Vitamin D Deficiency and supplements vitamin D without any suspected side-effects. Last vitamin D was at goal: Lab Results  Component Value Date   VD25OH 58 07/20/2016   Current Outpatient Prescriptions on File Prior to Visit  Medication Sig  . Adalimumab (HUMIRA PEN) 40 MG/0.8ML PNKT   . aspirin EC 81 MG tablet Take 81 mg by mouth.  . calcium carbonate (OS-CAL) 600 MG TABS tablet Take 600 mg by mouth 2 (two) times daily with a meal.  . Cholecalciferol (VITAMIN D3) 2000 units capsule Take by mouth.  . citalopram (CELEXA) 40 MG tablet TAKE 1 TABLET BY MOUTH EVERY DAY  . folic acid (FOLVITE) 1 MG tablet Take by mouth daily.  . hydrochlorothiazide (HYDRODIURIL) 25 MG tablet take 1 tablet by mouth once daily for blood pressure and  FLUID RETENTION  . levothyroxine (SYNTHROID, LEVOTHROID) 112 MCG tablet TAKE 1 TABLET BY MOUTH EVERY MORNING BEFORE BREAKFAST ON AN EMPTY STOMACH  . losartan (COZAAR) 100 MG tablet take 1 tablet by mouth once daily  . Magnesium 400 MG CAPS Take 400 mg by mouth 2 (two) times daily.  . methotrexate (RHEUMATREX) 2.5 MG tablet Take by mouth. Takes 8 pills on Fridays  . montelukast (SINGULAIR) 10 MG tablet Take 1 tablet (10 mg total) by mouth daily.  . Omega-3 Fatty Acids (FISH OIL PO) Take by mouth 2 (two) times daily.   Marland Kitchen triamcinolone cream (KENALOG) 0.5 % Apply 1 application topically 2 (two) times daily.   No current facility-administered medications on file prior to visit.    Allergies  Allergen Reactions  . Ace Inhibitors Cough    Other reaction(s): Other (See Comments) Made her cough  . Pneumococcal Vaccine     Other reaction(s): Other (See Comments) Caused arm to swell   PMHx:   Past Medical History:  Diagnosis Date  . Allergy   . Hyperlipidemia   . Hypertension   . Prediabetes   . Rheumatoid arthritis (HCC)   . Thyroid disease   . Vitamin D deficiency    Immunization History  Administered Date(s) Administered  . DT 06/22/2015  . Influenza, High Dose Seasonal PF 02/03/2014, 02/14/2015  . Influenza-Unspecified 03/02/2016  . Pneumococcal Conjugate-13 07/16/2014  . Pneumococcal Polysaccharide-23 05/30/2003, 09/30/2015  .  Td 05/29/2005  . Zoster 05/29/2006   Past Surgical History:  Procedure Laterality Date  . ANKLE ARTHROSCOPY WITH OPEN REDUCTION INTERNAL FIXATION (ORIF) Right 2004  . APPENDECTOMY    . CHOLECYSTECTOMY    . COCHLEAR IMPLANT Right 2007  . OTHER SURGICAL HISTORY     4 ear surgeries  . OTHER SURGICAL HISTORY     Right temple bone surgery  . TUBAL LIGATION Bilateral    FHx:    Reviewed / unchanged  SHx:    Reviewed / unchanged  Systems Review:  Constitutional: Denies fever, chills, wt changes, headaches, insomnia, fatigue, night sweats, change in  appetite. Eyes: Denies redness, blurred vision, diplopia, discharge, itchy, watery eyes.  ENT: Denies discharge, congestion, post nasal drip, epistaxis, sore throat, earache, hearing loss, dental pain, tinnitus, vertigo, sinus pain, snoring.  CV: Denies chest pain, palpitations, irregular heartbeat, syncope, dyspnea, diaphoresis, orthopnea, PND, claudication or edema. Respiratory: denies cough, dyspnea, DOE, pleurisy, hoarseness, laryngitis, wheezing.  Gastrointestinal: Denies dysphagia, odynophagia, heartburn, reflux, water brash, abdominal pain or cramps, nausea, vomiting, bloating, diarrhea, constipation, hematemesis, melena, hematochezia  or hemorrhoids. Genitourinary: Denies dysuria, frequency, urgency, nocturia, hesitancy, discharge, hematuria or flank pain. Musculoskeletal: Denies arthralgias, myalgias, stiffness, jt. swelling, pain, limping or strain/sprain.  Skin: Denies pruritus, rash, hives, warts, acne, eczema or change in skin lesion(s). Neuro: No weakness, tremor, incoordination, spasms, paresthesia or pain. Psychiatric: Denies confusion, memory loss or sensory loss. Endo: Denies change in weight, skin or hair change.  Heme/Lymph: No excessive bleeding, bruising or enlarged lymph nodes.  Physical Exam  BP 114/76   Pulse 84   Temp (!) 97.5 F (36.4 C)   Resp 18   Ht 5\' 6"  (1.676 m)   Wt 195 lb 6.4 oz (88.6 kg)   BMI 31.54 kg/m   Appears well nourished, well groomed  and in no distress.  Eyes: PERRLA, EOMs, conjunctiva no swelling or erythema. Sinuses: No frontal/maxillary tenderness ENT/Mouth:  Rt Cochlear implant fixed Rt post auricular area. EAC's clear, TM's nl w/o erythema, bulging. Nares clear w/o erythema, swelling, exudates. Oropharynx clear without erythema or exudates. Oral hygiene is good. Tongue normal, non obstructing. Hearing intact.  Neck: Supple. Thyroid nl. Car 2+/2+ without bruits, nodes or JVD. Chest: Respirations nl with BS clear & equal w/o rales,  rhonchi, wheezing or stridor.  Cor: Heart sounds normal w/ regular rate and rhythm without sig. murmurs, gallops, clicks or rubs. Peripheral pulses normal and equal  without edema.  Abdomen: Soft & bowel sounds normal. Non-tender w/o guarding, rebound, hernias, masses or organomegaly.  Lymphatics: Unremarkable.  Musculoskeletal: Full ROM all peripheral extremities, joint stability, 5/5 strength and normal gait.  Skin: Warm, dry without exposed rashes, lesions or ecchymosis apparent.  Neuro: Cranial nerves intact, reflexes equal bilaterally. Sensory-motor testing grossly intact. Tendon reflexes grossly intact.  Pysch: Alert & oriented x 3.  Insight and judgement nl & appropriate. No ideations.  Assessment and Plan:  1. Essential hypertension  - Continue medication, monitor blood pressure at home.  - Continue DASH diet. Reminder to go to the ER if any CP,  SOB, nausea, dizziness, severe HA, changes vision/speech.  - CBC with Differential/Platelet - BASIC METABOLIC PANEL WITH GFR - Magnesium - TSH  2. Hyperlipidemia, mixed  - Continue diet/meds, exercise,& lifestyle modifications.  - Continue monitor periodic cholesterol/liver & renal functions   - Hepatic function panel - Lipid panel - TSH  3. Prediabetes  - Continue diet, exercise, lifestyle modifications.  - Monitor appropriate labs.  - Hemoglobin  A1c - Insulin, fasting  4. Vitamin D deficiency  - Continue supplementation.  - VITAMIN D 25 Hydroxy   5. Hypothyroidism  - TSH  6. Medication management  - CBC with Differential/Platelet - BASIC METABOLIC PANEL WITH GFR - Hepatic function panel - Magnesium - Lipid panel - TSH - Hemoglobin A1c - Insulin, fasting - VITAMIN D 25 Hydroxy         Discussed  regular exercise, BP monitoring, weight control to achieve/maintain BMI less than 25 and discussed med and SE's. Recommended labs to assess and monitor clinical status with further disposition pending results of  labs. Over 30 minutes of exam, counseling, chart review was performed.

## 2017-02-01 LAB — CBC WITH DIFFERENTIAL/PLATELET
BASOS ABS: 102 {cells}/uL (ref 0–200)
Basophils Relative: 1.4 %
EOS PCT: 4.6 %
Eosinophils Absolute: 336 cells/uL (ref 15–500)
HCT: 40.6 % (ref 35.0–45.0)
Hemoglobin: 13.6 g/dL (ref 11.7–15.5)
Lymphs Abs: 2789 cells/uL (ref 850–3900)
MCH: 30.6 pg (ref 27.0–33.0)
MCHC: 33.5 g/dL (ref 32.0–36.0)
MCV: 91.2 fL (ref 80.0–100.0)
MONOS PCT: 6.1 %
MPV: 10.4 fL (ref 7.5–12.5)
NEUTROS PCT: 49.7 %
Neutro Abs: 3628 cells/uL (ref 1500–7800)
PLATELETS: 285 10*3/uL (ref 140–400)
RBC: 4.45 10*6/uL (ref 3.80–5.10)
RDW: 14.2 % (ref 11.0–15.0)
TOTAL LYMPHOCYTE: 38.2 %
WBC mixed population: 445 cells/uL (ref 200–950)
WBC: 7.3 10*3/uL (ref 3.8–10.8)

## 2017-02-01 LAB — LIPID PANEL
CHOLESTEROL: 180 mg/dL (ref <200)
HDL: 47 mg/dL — AB (ref >50)
LDL Cholesterol (Calc): 105 mg/dL (calc) — ABNORMAL HIGH
Non-HDL Cholesterol (Calc): 133 mg/dL (calc) — ABNORMAL HIGH (ref <130)
Total CHOL/HDL Ratio: 3.8 (calc) (ref <5.0)
Triglycerides: 157 mg/dL — ABNORMAL HIGH (ref <150)

## 2017-02-01 LAB — BASIC METABOLIC PANEL WITH GFR
BUN: 13 mg/dL (ref 7–25)
CHLORIDE: 100 mmol/L (ref 98–110)
CO2: 28 mmol/L (ref 20–32)
Calcium: 9.8 mg/dL (ref 8.6–10.4)
Creat: 0.79 mg/dL (ref 0.60–0.93)
GFR, EST NON AFRICAN AMERICAN: 72 mL/min/{1.73_m2} (ref >=60)
GFR, Est African American: 84 mL/min/{1.73_m2} (ref >=60)
GLUCOSE: 95 mg/dL (ref 65–99)
Potassium: 3.9 mmol/L (ref 3.5–5.3)
Sodium: 138 mmol/L (ref 135–146)

## 2017-02-01 LAB — HEPATIC FUNCTION PANEL
AG Ratio: 1.7 (calc) (ref 1.0–2.5)
ALKALINE PHOSPHATASE (APISO): 84 U/L (ref 33–130)
ALT: 24 U/L (ref 6–29)
AST: 18 U/L (ref 10–35)
Albumin: 4.4 g/dL (ref 3.6–5.1)
BILIRUBIN INDIRECT: 0.5 mg/dL (ref 0.2–1.2)
Bilirubin, Direct: 0.1 mg/dL (ref 0.0–0.2)
Globulin: 2.6 g/dL (calc) (ref 1.9–3.7)
TOTAL PROTEIN: 7 g/dL (ref 6.1–8.1)
Total Bilirubin: 0.6 mg/dL (ref 0.2–1.2)

## 2017-02-01 LAB — MAGNESIUM: MAGNESIUM: 1.9 mg/dL (ref 1.5–2.5)

## 2017-02-01 LAB — HEMOGLOBIN A1C
HEMOGLOBIN A1C: 5.9 %{Hb} — AB (ref <5.7)
MEAN PLASMA GLUCOSE: 123 (calc)
eAG (mmol/L): 6.8 (calc)

## 2017-02-01 LAB — INSULIN, FASTING: Insulin: 9.3 u[IU]/mL (ref 2.0–19.6)

## 2017-02-01 LAB — VITAMIN D 25 HYDROXY (VIT D DEFICIENCY, FRACTURES): Vit D, 25-Hydroxy: 54 ng/mL (ref 30–100)

## 2017-02-01 LAB — TSH: TSH: 0.54 mIU/L (ref 0.40–4.50)

## 2017-02-07 ENCOUNTER — Other Ambulatory Visit: Payer: Self-pay

## 2017-02-07 ENCOUNTER — Other Ambulatory Visit: Payer: Self-pay | Admitting: *Deleted

## 2017-02-07 MED ORDER — GLUCOSE BLOOD VI STRP: ORAL_STRIP | 4 refills | Status: AC

## 2017-02-07 MED ORDER — GLUCOSE BLOOD VI STRP: ORAL_STRIP | 4 refills | Status: DC

## 2017-02-14 ENCOUNTER — Other Ambulatory Visit: Payer: Self-pay | Admitting: *Deleted

## 2017-02-14 MED ORDER — ACCU-CHEK SOFT TOUCH LANCETS MISC: 12 refills | Status: AC

## 2017-02-26 HISTORY — PX: CATARACT EXTRACTION, BILATERAL: SHX1313

## 2017-02-28 ENCOUNTER — Other Ambulatory Visit: Payer: Self-pay | Admitting: Internal Medicine

## 2017-03-02 ENCOUNTER — Other Ambulatory Visit: Payer: Self-pay | Admitting: Physician Assistant

## 2017-03-05 ENCOUNTER — Encounter: Payer: Self-pay | Admitting: Internal Medicine

## 2017-03-21 ENCOUNTER — Other Ambulatory Visit: Payer: Self-pay | Admitting: Internal Medicine

## 2017-03-21 DIAGNOSIS — R059 Cough, unspecified: Secondary | ICD-10-CM

## 2017-03-21 DIAGNOSIS — T7840XS Allergy, unspecified, sequela: Secondary | ICD-10-CM

## 2017-03-21 DIAGNOSIS — R05 Cough: Secondary | ICD-10-CM

## 2017-04-03 ENCOUNTER — Other Ambulatory Visit: Payer: Self-pay | Admitting: Internal Medicine

## 2017-05-03 NOTE — Progress Notes (Signed)
FOLLOW UP  Assessment and Plan:   Hypertension Well controlled with current medications  Monitor blood pressure at home; patient to call if consistently greater than 130/80 Continue DASH diet.   Reminder to go to the ER if any CP, SOB, nausea, dizziness, severe HA, changes vision/speech, left arm numbness and tingling and jaw pain.  Cholesterol Currently treated by omega 3 supplements with borderline values  Continue to encourage low cholesterol diet and exercise.  Check lipid panel.   Prediabetes Discussed disease process and risks of poor control Continue to encourage improved diet and exercise.  Perform daily foot/skin check, notify office of any concerning changes.  Check A1C  Obesity with co morbidities Long discussion about weight loss, diet, and exercise Discussed ideal weight for height  Will follow up in 3 months  Vitamin D Def/ osteoporosis prevention Continue supplementation Check Vit D level  Depression/anxiety Currently well controlled/inremission; continue medications - the patient will cut down to 1/2 dose after the holidays and follow up in 3 months Lifestyle discussed: diet/exerise, sleep hygiene, stress management, hydration  Rheumatoid arthritis (HCC) Prescribed immunosuppressant and DMARD by rheumatology Monitored closely by this office for signs of infection/ cancers Check CBC  Hypothyroidism continue medications the same reminded to take on an empty stomach 30-56mins before food.  check TSH level  Bilateral cataracts Removed bilaterally in October - doing well   Continue diet and meds as discussed. Further disposition pending results of labs. Discussed med's effects and SE's.   Over 30 minutes of exam, counseling, chart review, and critical decision making was performed.   Future Appointments  Date Time Provider Department Center  08/07/2017 10:00 AM Quentin Mulling, PA-C [facility] None     ----------------------------------------------------------------------------------------------------------------------  HPI 76 y.o. female  presents for 3 month follow up on hypertension, cholesterol, prediabetes, obesity, hypothyroid, depression and vitamin D deficiency. She also has a diagnosis of RA and is treated by immunosuppressant and DMARD. We are monitoring her closely along with Dr. Kathi Ludwig.   she has a diagnosis of depression and is currently on celexa 40 mg daily, reports symptoms are well controlled on current regimen. She reports she has tried to taper off in the past and did not do well - she would like to try coming off again after the holidays.   BMI is Body mass index is 31.31 kg/m., she has been working on diet and exercise. Wt Readings from Last 3 Encounters:  05/04/17 194 lb (88 kg)  01/31/17 195 lb 6.4 oz (88.6 kg)  10/31/16 197 lb 9.6 oz (89.6 kg)   Her blood pressure has been controlled at home, today their BP is BP: 118/66  She does not workout - but works in her yard and doing vigorous hosework, walking. She denies chest pain, shortness of breath, dizziness.   She is not on cholesterol medication (on omega 3 supplement) and denies myalgias. Her cholesterol is not at goal. The cholesterol last visit was:   Lab Results  Component Value Date   CHOL 180 01/31/2017   HDL 47 (L) 01/31/2017   LDLCALC 102 (H) 10/31/2016   TRIG 157 (H) 01/31/2017   CHOLHDL 3.8 01/31/2017    She has been working on diet and exercise for prediabetes, and denies increased appetite, nausea, paresthesia of the feet, polydipsia, polyuria and visual disturbances. Last A1C in the office was:  Lab Results  Component Value Date   HGBA1C 5.9 (H) 01/31/2017   She is on thyroid medication. Her medication was not changed last visit.  Lab Results  Component Value Date   TSH 0.54 01/31/2017   Patient is on Vitamin D supplement and below goal at last visit:    Lab Results  Component Value  Date   VD25OH 54 01/31/2017       Current Medications:  Current Outpatient Medications on File Prior to Visit  Medication Sig  . Adalimumab (HUMIRA PEN) 40 MG/0.8ML PNKT   . aspirin EC 81 MG tablet Take 81 mg by mouth.  . calcium carbonate (OS-CAL) 600 MG TABS tablet Take 600 mg by mouth 2 (two) times daily with a meal.  . Cholecalciferol (VITAMIN D3) 2000 units capsule Take 4,000 Units by mouth.   . citalopram (CELEXA) 40 MG tablet TAKE 1 TABLET BY MOUTH EVERY DAY  . folic acid (FOLVITE) 1 MG tablet Take by mouth daily.  Marland Kitchen glucose blood (FREESTYLE LITE) test strip Test Blood Sugar one time daily-DX-R73.09  . hydrochlorothiazide (HYDRODIURIL) 25 MG tablet TAKE 1 TABLET BY MOUTH EVERY DAY FOR BLOOD PRESSURE AND FLUID RETENTION  . Lancets (ACCU-CHEK SOFT TOUCH) lancets Check blood sugar 1 time daily-DX-R73.03  . levothyroxine (SYNTHROID, LEVOTHROID) 112 MCG tablet TAKE 1 TABLET BY MOUTH EVERY MORNING BEFORE BREAKFAST ON AN EMPTY STOMACH  . losartan (COZAAR) 100 MG tablet TAKE 1 TABLET BY MOUTH EVERY DAY  . Magnesium 400 MG CAPS Take 400 mg by mouth 2 (two) times daily.  . methotrexate (RHEUMATREX) 2.5 MG tablet Take by mouth. Takes 8 pills on Fridays  . montelukast (SINGULAIR) 10 MG tablet TAKE 1 TABLET(10 MG) BY MOUTH DAILY  . Omega-3 Fatty Acids (FISH OIL PO) Take by mouth 2 (two) times daily.   Marland Kitchen triamcinolone cream (KENALOG) 0.5 % Apply 1 application topically 2 (two) times daily. (Patient taking differently: Apply 1 application topically as needed. )   No current facility-administered medications on file prior to visit.      Allergies:  Allergies  Allergen Reactions  . Ace Inhibitors Cough    Other reaction(s): Other (See Comments) Made her cough  . Pneumococcal Vaccine     Other reaction(s): Other (See Comments) Caused arm to swell     Medical History:  Past Medical History:  Diagnosis Date  . Allergy   . Cataract 05/04/2017   Bilateral - removed 2018   .  Hyperlipidemia   . Hypertension   . Prediabetes   . Rheumatoid arthritis (HCC)   . Thyroid disease   . Vitamin D deficiency    Family history- Reviewed and unchanged Social history- Reviewed and unchanged   Review of Systems:  Review of Systems  Constitutional: Negative for malaise/fatigue and weight loss.  HENT: Negative for hearing loss and tinnitus.   Eyes: Negative for blurred vision and double vision.  Respiratory: Negative for cough, shortness of breath and wheezing.   Cardiovascular: Negative for chest pain, palpitations, orthopnea, claudication and leg swelling.  Gastrointestinal: Negative for abdominal pain, blood in stool, constipation, diarrhea, heartburn, melena, nausea and vomiting.  Genitourinary: Negative.   Musculoskeletal: Positive for joint pain (Knees). Negative for myalgias.  Skin: Negative for rash.  Neurological: Negative for dizziness, tingling, sensory change, weakness and headaches.  Endo/Heme/Allergies: Negative for polydipsia.  Psychiatric/Behavioral: Negative.  Negative for depression. The patient is not nervous/anxious and does not have insomnia.   All other systems reviewed and are negative.     Physical Exam: BP 118/66   Pulse 90   Temp 97.7 F (36.5 C)   Ht 5\' 6"  (1.676 m)   Wt 194 lb (  88 kg)   SpO2 96%   BMI 31.31 kg/m  Wt Readings from Last 3 Encounters:  05/04/17 194 lb (88 kg)  01/31/17 195 lb 6.4 oz (88.6 kg)  10/31/16 197 lb 9.6 oz (89.6 kg)   General Appearance: Well nourished, in no apparent distress. Eyes: PERRLA, EOMs, conjunctiva no swelling or erythema Sinuses: No Frontal/maxillary tenderness ENT/Mouth: Ext aud canals clear, TMs without erythema, bulging. No erythema, swelling, or exudate on post pharynx.  Tonsils not swollen or erythematous. Patient HOH with permanent hearing aid on R   Neck: Supple, thyroid normal.  Respiratory: Respiratory effort normal, BS equal bilaterally without rales, rhonchi, wheezing or stridor.   Cardio: RRR with no MRGs. Brisk peripheral pulses without edema.  Abdomen: Soft, + BS.  Non tender, no guarding, rebound, hernias, masses. Lymphatics: Non tender without lymphadenopathy.  Musculoskeletal: Full ROM, 5/5 strength, Normal gait Skin: Warm, dry without rashes, lesions, ecchymosis.  Neuro: Cranial nerves intact. No cerebellar symptoms.  Psych: Awake and oriented X 3, normal affect, Insight and Judgment appropriate.    Dan Maker, NP 10:08 AM [facility]

## 2017-05-04 ENCOUNTER — Ambulatory Visit: Payer: Medicare Other | Admitting: Adult Health

## 2017-05-04 ENCOUNTER — Encounter: Payer: Self-pay | Admitting: Adult Health

## 2017-05-04 VITALS — BP 118/66 | HR 90 | Temp 97.7°F | Ht 66.0 in | Wt 194.0 lb

## 2017-05-04 DIAGNOSIS — F325 Major depressive disorder, single episode, in full remission: Secondary | ICD-10-CM | POA: Diagnosis not present

## 2017-05-04 DIAGNOSIS — E559 Vitamin D deficiency, unspecified: Secondary | ICD-10-CM | POA: Diagnosis not present

## 2017-05-04 DIAGNOSIS — E039 Hypothyroidism, unspecified: Secondary | ICD-10-CM | POA: Diagnosis not present

## 2017-05-04 DIAGNOSIS — E66811 Obesity, class 1: Secondary | ICD-10-CM

## 2017-05-04 DIAGNOSIS — E669 Obesity, unspecified: Secondary | ICD-10-CM

## 2017-05-04 DIAGNOSIS — Z79899 Other long term (current) drug therapy: Secondary | ICD-10-CM | POA: Diagnosis not present

## 2017-05-04 DIAGNOSIS — E782 Mixed hyperlipidemia: Secondary | ICD-10-CM | POA: Diagnosis not present

## 2017-05-04 DIAGNOSIS — H259 Unspecified age-related cataract: Secondary | ICD-10-CM | POA: Diagnosis not present

## 2017-05-04 DIAGNOSIS — R7309 Other abnormal glucose: Secondary | ICD-10-CM | POA: Diagnosis not present

## 2017-05-04 DIAGNOSIS — I10 Essential (primary) hypertension: Secondary | ICD-10-CM | POA: Diagnosis not present

## 2017-05-04 DIAGNOSIS — H269 Unspecified cataract: Secondary | ICD-10-CM

## 2017-05-04 DIAGNOSIS — M069 Rheumatoid arthritis, unspecified: Secondary | ICD-10-CM | POA: Diagnosis not present

## 2017-05-04 HISTORY — DX: Unspecified cataract: H26.9

## 2017-05-04 NOTE — Patient Instructions (Signed)
To improve your triglycerides keep sugar and white flour products to a minimum - save for special occasions   Food Choices to Lower Your Triglycerides Triglycerides are a type of fat in your blood. High levels of triglycerides can increase the risk of heart disease and stroke. If your triglyceride levels are high, the foods you eat and your eating habits are very important. Choosing the right foods can help lower your triglycerides. What general guidelines do I need to follow?  Lose weight if you are overweight.  Limit or avoid alcohol.  Fill one half of your plate with vegetables and green salads.  Limit fruit to two servings a day. Choose fruit instead of juice.  Make one fourth of your plate whole grains. Look for the word "whole" as the first word in the ingredient list.  Fill one fourth of your plate with lean protein foods.  Enjoy fatty fish (such as salmon, mackerel, sardines, and tuna) three times a week.  Choose healthy fats.  Limit foods high in starch and sugar.  Eat more home-cooked food and less restaurant, buffet, and fast food.  Limit fried foods.  Cook foods using methods other than frying.  Limit saturated fats.  Check ingredient lists to avoid foods with partially hydrogenated oils (trans fats) in them. What foods can I eat? Grains Whole grains, such as whole wheat or whole grain breads, crackers, cereals, and pasta. Unsweetened oatmeal, bulgur, barley, quinoa, or brown rice. Corn or whole wheat flour tortillas. Vegetables Fresh or frozen vegetables (raw, steamed, roasted, or grilled). Green salads. Fruits All fresh, canned (in natural juice), or frozen fruits. Meat and Other Protein Products Ground beef (85% or leaner), grass-fed beef, or beef trimmed of fat. Skinless chicken or [place]. Ground chicken or [place]. Pork trimmed of fat. All fish and seafood. Eggs. Dried beans, peas, or lentils. Unsalted nuts or seeds. Unsalted canned or dry  beans. Dairy Low-fat dairy products, such as skim or 1% milk, 2% or reduced-fat cheeses, low-fat ricotta or cottage cheese, or plain low-fat yogurt. Fats and Oils Tub margarines without trans fats. Light or reduced-fat mayonnaise and salad dressings. Avocado. Safflower, olive, or canola oils. Natural peanut or almond butter. The items listed above may not be a complete list of recommended foods or beverages. Contact your dietitian for more options. What foods are not recommended? Grains White bread. White pasta. White rice. Cornbread. Bagels, pastries, and croissants. Crackers that contain trans fat. Vegetables White potatoes. Corn. Creamed or fried vegetables. Vegetables in a cheese sauce. Fruits Dried fruits. Canned fruit in light or heavy syrup. Fruit juice. Meat and Other Protein Products Fatty cuts of meat. Ribs, chicken wings, bacon, sausage, bologna, salami, chitterlings, fatback, hot dogs, bratwurst, and packaged luncheon meats. Dairy Whole or 2% milk, cream, half-and-half, and cream cheese. Whole-fat or sweetened yogurt. Full-fat cheeses. Nondairy creamers and whipped toppings. Processed cheese, cheese spreads, or cheese curds. Sweets and Desserts Corn syrup, sugars, honey, and molasses. Candy. Jam and jelly. Syrup. Sweetened cereals. Cookies, pies, cakes, donuts, muffins, and ice cream. Fats and Oils Butter, stick margarine, lard, shortening, ghee, or bacon fat. Coconut, palm kernel, or palm oils. Beverages Alcohol. Sweetened drinks (such as sodas, lemonade, and fruit drinks or punches). The items listed above may not be a complete list of foods and beverages to avoid. Contact your dietitian for more information. This information is not intended to replace advice given to you by your health care provider. Make sure you discuss any questions you have with your  health care provider. Document Released: 03/02/2004 Document Revised: 10/21/2015 Document Reviewed: 03/19/2013 Elsevier  Interactive Patient Education  2017 [employer].

## 2017-05-05 LAB — BASIC METABOLIC PANEL WITH GFR
BUN: 19 mg/dL (ref 7–25)
CALCIUM: 10.1 mg/dL (ref 8.6–10.4)
CHLORIDE: 102 mmol/L (ref 98–110)
CO2: 31 mmol/L (ref 20–32)
Creat: 0.91 mg/dL (ref 0.60–0.93)
GFR, Est African American: 71 mL/min/{1.73_m2} (ref >=60)
GFR, Est Non African American: 61 mL/min/{1.73_m2} (ref >=60)
GLUCOSE: 101 mg/dL — AB (ref 65–99)
POTASSIUM: 4.4 mmol/L (ref 3.5–5.3)
Sodium: 141 mmol/L (ref 135–146)

## 2017-05-05 LAB — CBC WITH DIFFERENTIAL/PLATELET
BASOS PCT: 1.3 %
Basophils Absolute: 81 cells/uL (ref 0–200)
EOS PCT: 4.5 %
Eosinophils Absolute: 279 cells/uL (ref 15–500)
HCT: 40.1 % (ref 35.0–45.0)
Hemoglobin: 13.6 g/dL (ref 11.7–15.5)
Lymphs Abs: 2375 cells/uL (ref 850–3900)
MCH: 31.1 pg (ref 27.0–33.0)
MCHC: 33.9 g/dL (ref 32.0–36.0)
MCV: 91.8 fL (ref 80.0–100.0)
MPV: 10.6 fL (ref 7.5–12.5)
Monocytes Relative: 7.7 %
NEUTROS PCT: 48.2 %
Neutro Abs: 2988 cells/uL (ref 1500–7800)
PLATELETS: 286 10*3/uL (ref 140–400)
RBC: 4.37 10*6/uL (ref 3.80–5.10)
RDW: 13.8 % (ref 11.0–15.0)
Total Lymphocyte: 38.3 %
WBC: 6.2 10*3/uL (ref 3.8–10.8)
WBCMIX: 477 {cells}/uL (ref 200–950)

## 2017-05-05 LAB — HEPATIC FUNCTION PANEL
AG Ratio: 1.4 (calc) (ref 1.0–2.5)
ALBUMIN MSPROF: 4 g/dL (ref 3.6–5.1)
ALT: 20 U/L (ref 6–29)
AST: 20 U/L (ref 10–35)
Alkaline phosphatase (APISO): 84 U/L (ref 33–130)
BILIRUBIN DIRECT: 0.1 mg/dL (ref 0.0–0.2)
GLOBULIN: 2.9 g/dL (ref 1.9–3.7)
Indirect Bilirubin: 0.4 mg/dL (calc) (ref 0.2–1.2)
TOTAL PROTEIN: 6.9 g/dL (ref 6.1–8.1)
Total Bilirubin: 0.5 mg/dL (ref 0.2–1.2)

## 2017-05-05 LAB — HEMOGLOBIN A1C
EAG (MMOL/L): 6.8 (calc)
Hgb A1c MFr Bld: 5.9 % of total Hgb — ABNORMAL HIGH (ref <5.7)
Mean Plasma Glucose: 123 (calc)

## 2017-05-05 LAB — LIPID PANEL
CHOL/HDL RATIO: 3.4 (calc) (ref <5.0)
CHOLESTEROL: 168 mg/dL (ref <200)
HDL: 50 mg/dL — AB (ref >50)
LDL CHOLESTEROL (CALC): 91 mg/dL
Non-HDL Cholesterol (Calc): 118 mg/dL (calc) (ref <130)
TRIGLYCERIDES: 163 mg/dL — AB (ref <150)

## 2017-05-05 LAB — TSH: TSH: 1.97 mIU/L (ref 0.40–4.50)

## 2017-05-05 LAB — VITAMIN D 25 HYDROXY (VIT D DEFICIENCY, FRACTURES): VIT D 25 HYDROXY: 61 ng/mL (ref 30–100)

## 2017-05-30 ENCOUNTER — Other Ambulatory Visit: Payer: Self-pay | Admitting: Internal Medicine

## 2017-06-05 ENCOUNTER — Emergency Department (HOSPITAL_BASED_OUTPATIENT_CLINIC_OR_DEPARTMENT_OTHER): Payer: Medicare Other

## 2017-06-05 ENCOUNTER — Other Ambulatory Visit: Payer: Self-pay

## 2017-06-05 ENCOUNTER — Emergency Department (HOSPITAL_BASED_OUTPATIENT_CLINIC_OR_DEPARTMENT_OTHER)
Admission: EM | Admit: 2017-06-05 | Discharge: 2017-06-05 | Disposition: A | Discharge status: Home / Self Care | Payer: Medicare Other | Attending: Emergency Medicine | Admitting: Emergency Medicine

## 2017-06-05 ENCOUNTER — Encounter (HOSPITAL_BASED_OUTPATIENT_CLINIC_OR_DEPARTMENT_OTHER): Payer: Self-pay | Admitting: Emergency Medicine

## 2017-06-05 DIAGNOSIS — J449 Chronic obstructive pulmonary disease, unspecified: Secondary | ICD-10-CM | POA: Diagnosis not present

## 2017-06-05 DIAGNOSIS — N182 Chronic kidney disease, stage 2 (mild): Secondary | ICD-10-CM | POA: Diagnosis not present

## 2017-06-05 DIAGNOSIS — Z7982 Long term (current) use of aspirin: Secondary | ICD-10-CM | POA: Insufficient documentation

## 2017-06-05 DIAGNOSIS — Z79899 Other long term (current) drug therapy: Secondary | ICD-10-CM | POA: Diagnosis not present

## 2017-06-05 DIAGNOSIS — I129 Hypertensive chronic kidney disease with stage 1 through stage 4 chronic kidney disease, or unspecified chronic kidney disease: Secondary | ICD-10-CM | POA: Diagnosis not present

## 2017-06-05 DIAGNOSIS — M5432 Sciatica, left side: Secondary | ICD-10-CM | POA: Diagnosis not present

## 2017-06-05 DIAGNOSIS — M25552 Pain in left hip: Secondary | ICD-10-CM | POA: Diagnosis present

## 2017-06-05 DIAGNOSIS — Z87891 Personal history of nicotine dependence: Secondary | ICD-10-CM | POA: Insufficient documentation

## 2017-06-05 DIAGNOSIS — E039 Hypothyroidism, unspecified: Secondary | ICD-10-CM | POA: Diagnosis not present

## 2017-06-05 MED ORDER — TRAMADOL HCL 50 MG PO TABS: 50.0000 mg | ORAL_TABLET | Freq: Four times a day (QID) | ORAL | 0 refills | Status: DC | PRN

## 2017-06-05 MED ORDER — LIDOCAINE 5 % EX PTCH: 1.0000 | MEDICATED_PATCH | CUTANEOUS | 0 refills | Status: DC

## 2017-06-05 MED FILL — traMADol HCL 50 MG TABS: 50 | 3 days supply | Qty: 15 | Fill #0

## 2017-06-05 NOTE — ED Notes (Signed)
Patient transported to X-ray 

## 2017-06-05 NOTE — ED Triage Notes (Signed)
Pt fell last night in the bathtub and pulled something in her lower back and left thigh.  No head injury

## 2017-06-05 NOTE — ED Provider Notes (Signed)
Emergency Department Provider Note   I have reviewed the triage vital signs and the nursing notes.   HISTORY  Chief Complaint Fall   HPI Angelica Parker is a 77 y.o. female with PMH of HTN, HLD, and RA she is to the emergency department for evaluation of left hip pain radiating down the leg.  The patient was in the bathtub when she suddenly had pain in the left hip, buttock, thigh.  She describes current pain in the entire leg.  No numbness or weakness.  No fall or injury.  Patient has been ambulatory but has some pain with ambulation. No leg swelling. No numbness/tingling. Pain is constant and worse with movement. Radiates down the back of the left leg.    Past Medical History:  Diagnosis Date  . Allergy   . Cataract 05/04/2017   Bilateral - removed 2018   . Hyperlipidemia   . Hypertension   . Prediabetes   . Rheumatoid arthritis (HCC)   . Thyroid disease   . Vitamin D deficiency     Patient Active Problem List   Diagnosis Date Noted  . 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 Depression, major, in remission (HCC) 10/26/2015  . COPD (chronic obstructive pulmonary disease) (HCC) 10/26/2015  . Other abnormal glucose 05/13/2015  . Obesity (BMI 30.0-34.9) 12/30/2014  . CKD (chronic kidney disease) stage 2, GFR 60-89 ml/min 06/02/2014  . Medication management 07/31/2013  . Hyperlipemia   . HTN (hypertension)   . Hypothyroidism   . Vitamin D deficiency   . Rheumatoid arthritis (HCC)   . 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 ANKLE ARTHROSCOPY WITH OPEN REDUCTION INTERNAL FIXATION (ORIF) Right 2004  . APPENDECTOMY    . CATARACT EXTRACTION, BILATERAL Bilateral 02/2017   Dr. Adine Madura  . CHOLECYSTECTOMY    . COCHLEAR IMPLANT Right 2007  . OTHER SURGICAL HISTORY     4 ear surgeries  . OTHER SURGICAL HISTORY     Right temple bone surgery  .  TUBAL LIGATION Bilateral     Current Outpatient Rx  . Order #: [id number] Class: Historical Med  . Order #: [id number] Class: Historical Med  . Order #: [id number] Class: Historical Med  . Order #: [id number] Class: Historical Med  . Order #: [id number] Class: Normal  . Order #: [id number] Class: Historical Med  . Order #: [id number] Class: Print  . Order #: [id number] Class: Normal  . Order #: [id number] Class: Normal  . Order #: [id number] Class: Normal  . Order #: [id number] Class: Normal  . Order #: [id number] Class: Historical Med  . Order #: [id number] Class: Historical Med  . Order #: [id number] Class: Normal  . Order #: [id number] Class: Historical Med  . Order #: [id number] Class: Normal    Allergies Ace inhibitors and Pneumococcal vaccine  Family History  Problem Relation Age of Onset  . Heart attack Mother   . Hypertension Mother   . Lymphoma Mother     Social History Social History   Tobacco Use  . Smoking status: Former Smoker    Last attempt to quit: 09/08/1988    Years since quitting: 28.7  . Smokeless tobacco: Never Used  Substance Use Topics  . Alcohol use: No    Alcohol/week: 0.0 oz  . Drug use: No    Review of Systems  Constitutional: No fever/chills Eyes: No visual changes. ENT: No sore throat. Cardiovascular: Denies chest pain. Respiratory: Denies shortness of breath. Gastrointestinal: No  abdominal pain.  No nausea, no vomiting.  No diarrhea.  No constipation. Genitourinary: Negative for dysuria. Musculoskeletal: Negative for back pain. Positive left leg pain.  Skin: Negative for rash. Neurological: Negative for headaches, focal weakness or numbness.  10-point ROS otherwise negative.  ____________________________________________   PHYSICAL EXAM:  VITAL SIGNS: ED Triage Vitals  Enc Vitals Group     BP 06/05/17 0900 120/72     Pulse Rate 06/05/17 0900 76     Resp 06/05/17 0900 16     Temp 06/05/17 0900 98.4 F (36.9 C)     Temp Source 06/05/17 0900 Oral      SpO2 06/05/17 0900 99 %     Weight 06/05/17 0902 194 lb (88 kg)     Height 06/05/17 0902 5\' 6"  (1.676 m)     Pain Score 06/05/17 0903 8   Constitutional: Alert and oriented. Well appearing and in no acute distress. Eyes: Conjunctivae are normal. Head: Atraumatic. Nose: No congestion/rhinnorhea. Mouth/Throat: Mucous membranes are moist.  Neck: No stridor.  Cardiovascular: Normal rate, regular rhythm. Good peripheral circulation. Grossly normal heart sounds.   Respiratory: Normal respiratory effort.  No retractions. Lungs CTAB. Gastrointestinal: Soft and nontender. No distention.  Musculoskeletal: No lower extremity tenderness nor edema. No gross deformities of extremities. Mild pain with passive ROM of the left hip and knee. No leg swelling, bruising, or rash.  Neurologic:  Normal speech and language. No gross focal neurologic deficits are appreciated.  Skin:  Skin is warm, dry and intact. No rash noted.  ____________________________________________  RADIOLOGY  Dg Knee 2 Views Left  Result Date: 06/05/2017 CLINICAL DATA:  Left knee pain. EXAM: LEFT KNEE - 1-2 VIEW COMPARISON:  05/30/2016 FINDINGS: Tricompartmental degenerative changes most significant in the medial compartment with joint space narrowing and osteophytic spurring. No fracture or osteochondral lesion. No chondrocalcinosis. No definite joint effusion. Vascular calcifications are noted. 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 No acute bony findings or joint effusion. Electronically Signed   By: Rudie Meyer M.D.   On: 06/05/2017 11:09   Dg Hip Unilat W Or Wo Pelvis 2-3 Views Left  Result Date: 06/05/2017 CLINICAL DATA:  Left hip pain EXAM: DG HIP (WITH OR WITHOUT PELVIS) 2-3V LEFT COMPARISON:  None. FINDINGS: Early symmetric degenerative changes in the hips with early joint space narrowing and spurring. SI joints are symmetric and unremarkable. No acute bony abnormality.  Specifically, no fracture, subluxation, or dislocation. IMPRESSION: No acute bony abnormality. Electronically Signed   By: Charlett Nose M.D.   On: 06/05/2017 11:09    ____________________________________________   PROCEDURES  Procedure(s) performed:   Procedures  None ____________________________________________   INITIAL IMPRESSION / ASSESSMENT AND PLAN / ED COURSE  Pertinent labs & imaging results that were available during my care of the patient were reviewed by me and considered in my medical decision making (see chart for details).  Patient presents to the emerge department for evaluation of total left leg pain which began spontaneously last night.  She has normal neurological exam including strength and sensation.  No bowel or bladder symptoms.  No red flag signs or symptoms to suggest central cord involvement.  Plan for symptom relief and PCP follow-up.   At this time, I do not feel there is any life-threatening condition present. I have reviewed and discussed all results (EKG, imaging, lab, urine as appropriate), exam findings with patient. I have reviewed nursing notes and appropriate previous records.  I feel the patient is safe  to be discharged home without further emergent workup. Discussed usual and customary return precautions. Patient and family (if present) verbalize understanding and are comfortable with this plan.  Patient will follow-up with their primary care provider. If they do not have a primary care provider, information for follow-up has been provided to them. All questions have been answered.  ____________________________________________  FINAL CLINICAL IMPRESSION(S) / ED DIAGNOSES  Final diagnoses:  Sciatica of left side    Note:  This document was prepared using Dragon voice recognition software and may include unintentional dictation errors.  Alona Bene, MD Emergency Medicine    Reality Dejonge, Arlyss Repress, MD 06/06/17 [phone]

## 2017-06-05 NOTE — Discharge Instructions (Signed)
You have been seen in the Emergency Department (ED)  today for back pain.  Your workup and exam have not shown any acute abnormalities and you are likely suffering from muscle strain or possible problems with your discs, but there is no treatment that will fix your symptoms at this time.  Please take Motrin (ibuprofen) as needed for your pain according to the instructions written on the box.  Alternatively, for the next five days you can take 600mg  three times daily with meals (it may upset your stomach).  Take Tramadol as prescribed for severe pain. Do not drink alcohol, drive or participate in any other potentially dangerous activities while taking this medication as it may make you sleepy. Do not take this medication with any other sedating medications, either prescription or over-the-counter. If you were prescribed Percocet or Vicodin, do not take these with acetaminophen (Tylenol) as it is already contained within these medications.    Please follow up with your doctor as soon as possible regarding today's ED visit and your back pain.  Return to the ED for worsening back pain, fever, weakness or numbness of either leg, or if you develop either (1) an inability to urinate or have bowel movements, or (2) loss of your ability to control your bathroom functions (if you start having "accidents"), or if you develop other new symptoms that concern you.

## 2017-06-26 ENCOUNTER — Other Ambulatory Visit: Payer: Self-pay | Admitting: Physician Assistant

## 2017-06-26 DIAGNOSIS — R059 Cough, unspecified: Secondary | ICD-10-CM

## 2017-06-26 DIAGNOSIS — R05 Cough: Secondary | ICD-10-CM

## 2017-06-26 DIAGNOSIS — T7840XS Allergy, unspecified, sequela: Secondary | ICD-10-CM

## 2017-07-03 ENCOUNTER — Other Ambulatory Visit: Payer: Self-pay | Admitting: Adult Health

## 2017-07-24 ENCOUNTER — Encounter: Payer: Self-pay | Admitting: Physician Assistant

## 2017-08-03 NOTE — Progress Notes (Signed)
3 month follow up and CPE Assessment and Plan:  Essential hypertension - continue medications, DASH diet, exercise and monitor at home. Call if greater than 130/80.    Hypothyroidism, unspecified hypothyroidism type Hypothyroidism-check TSH level, continue medications the same, reminded to take on an empty stomach 30-20mins before food.    Hyperlipidemia -continue medications, check lipids, decrease fatty foods, increase activity.    Prediabetes Discussed general issues about diabetes pathophysiology and management., Educational material distributed., Suggested low cholesterol diet., Encouraged aerobic exercise., Discussed foot care., Reminded to get yearly retinal exam.  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 Screening labs and tests as requested with regular follow-up as recommended.  Future Appointments  Date Time Provider Department Center  12/25/2017 10:30 AM Lucky Cowboy, MD [facility] None  04/12/2018 10:30 AM Quentin Mulling, PA-C [facility] None  08/14/2018 10:00 AM Quentin Mulling, PA-C [facility] None      HPI  77 y.o. female  presents for follow up with COPD, RA, chol, preDM and CPE.   She has left knee pain, getting injections from ortho, went to ER Jan/08 had left sided weakness, continues to follow up with ortho.    Her blood pressure has been controlled at home, today  their BP is BP: 120/66 She does not workout but does yard work She denies chest pain, shortness of breath, dizziness.   She had endometrial thickening on [place] and went to GYN, sampling was normal.  She was a former smoker, has COPD. She  Is on  singulair which has helped, does not use albuterol or advair at this time. She denies orthopnea, PND, edema. She is on methotrexate, 8 on Friday. Last CXR was 05/2014, has never had PFTS.   She is not on cholesterol medication and denies myalgias. Her cholesterol is at goal. The cholesterol last visit was:   Lab Results  Component Value Date   CHOL 168 05/04/2017   HDL 50 (L) 05/04/2017   LDLCALC 102 (H) 10/31/2016   TRIG 163 (H) 05/04/2017   CHOLHDL 3.4 05/04/2017    She has been working on diet and exercise for prediabetes, and denies paresthesia of the feet, polydipsia, polyuria and visual disturbances. Last A1C in the office was:  Lab Results  Component Value Date   HGBA1C 5.9 (H) 05/04/2017   Patient is on Vitamin D supplement.   Lab Results  Component Value Date   VD25OH 61 05/04/2017    She has RA, follows with Dr. Emmit Pomfret. She is on methotrexate and humira.     She is on thyroid medication. Her medication was not changed last visit, it was decreased to one pill daily.  Lab Results  Component Value Date   TSH 1.97 05/04/2017   BMI is Body mass index is 31.28 kg/m., she is working on diet and exercise. Wt Readings from Last 3 Encounters:  08/07/17 193 lb 12.8 oz (87.9 kg)  06/05/17 194 lb (88 kg)  05/04/17 194 lb (88 kg)  Current Medications:  Current Outpatient Medications on File Prior to Visit  Medication Sig Dispense Refill  . Adalimumab (HUMIRA PEN) 40 MG/0.8ML PNKT     . aspirin EC 81 MG tablet Take 81 mg by mouth.    . calcium carbonate (OS-CAL) 600 MG TABS tablet Take 600 mg by mouth 2 (two) times daily with a meal.    . Cholecalciferol (VITAMIN D3) 2000 units capsule Take 4,000 Units by mouth.     . folic acid  (FOLVITE) 1 MG tablet Take by mouth daily.    Marland Kitchen glucose blood (FREESTYLE LITE) test strip Test Blood Sugar one time daily-DX-R73.09 100 each 4  . hydrochlorothiazide (HYDRODIURIL) 25 MG tablet TAKE 1 TABLET BY MOUTH EVERY DAY FOR BLOOD PRESSURE AND FLUID RETENTION 90 tablet 0  . Lancets (ACCU-CHEK SOFT TOUCH) lancets Check blood sugar 1 time daily-DX-R73.03 100 each 12  . levothyroxine (SYNTHROID, LEVOTHROID) 112 MCG tablet TAKE 1 TABLET BY MOUTH EVERY MORNING BEFORE BREAKFAST ON AN EMPTY STOMACH 90 tablet 0  . losartan (COZAAR) 100 MG tablet TAKE 1 TABLET BY MOUTH EVERY DAY 90 tablet 1  . Magnesium 400 MG CAPS Take 400 mg by mouth 2 (two) times daily.    . methotrexate (RHEUMATREX) 2.5 MG tablet Take by mouth. Takes 8 pills on Fridays    . montelukast (SINGULAIR) 10 MG tablet TAKE 1 TABLET(10 MG) BY MOUTH DAILY 90 tablet 0  . Omega-3 Fatty Acids (FISH OIL PO) Take by mouth 2 (two) times daily.     Marland Kitchen triamcinolone cream (KENALOG) 0.5 % Apply 1 application topically 2 (two) times daily. (Patient taking differently: Apply 1 application topically as needed. ) 80 g 2   No current facility-administered medications on file prior to visit.    Health Maintenance:   Immunization History  Administered Date(s) Administered  . DT 06/22/2015  . Influenza, High Dose Seasonal PF 02/03/2014, 02/14/2015  . Influenza-Unspecified 03/02/2016  . Pneumococcal Conjugate-13 07/16/2014  . Pneumococcal Polysaccharide-23 05/30/2003, 09/30/2015  . Td 05/29/2005  . Zoster 05/29/2006   Tetanus: 2017 Pneumovax: 2005 Prevnar: 2016 Flu vaccine: 2016 Zostavax: 2008   Pap: 2012 neg  MGM:  12/2016 DEXA: 2013 nl Declines Colonoscopy: 2010 + tics due 2020  EGD: N/A Vaginal [place] 07/20/2016 Echo 01/2010 45% to 50% with mild MR Last Dental Exam: Dr. Weston Anna Last Eye Exam: Dr. August Luz, Jan 2018  Patient Care Team: Lucky Cowboy, MD as PCP - General (Internal Medicine) Shea Evans, MD as Consulting  Physician (Obstetrics and Gynecology) Bernette Redbird, MD as Consulting Physician (Gastroenterology) May, Ronney Asters, MD as Referring Physician (Otolaryngology) Berneice Gandy, MD as Referring Physician (Dermatology) Rossie Muskrat, MD as Consulting Physician (Rheumatology)  Medical History:  Past Medical History:  Diagnosis Date  . Allergy   . Cataract 05/04/2017   Bilateral - removed 2018   . Hyperlipidemia   . Hypertension   . Prediabetes   . Rheumatoid arthritis (HCC)   . Thyroid disease   . Vitamin D deficiency    Allergies Allergies  Allergen Reactions  . Ace Inhibitors Cough    Other reaction(s): Other (See Comments) Made her cough  . Pneumococcal Vaccine     Other reaction(s): Other (See Comments) Caused arm to swell    SURGICAL HISTORY She  has a past surgical history that includes Cochlear implant (Right, 2007); Appendectomy; Tubal ligation (Bilateral); Cholecystectomy; Other surgical history; Ankle arthroscopy with open reduction internal fixation (orif) (Right, 2004); Other surgical history; and Cataract extraction, bilateral (Bilateral, 02/2017).  FAMILY HISTORY Her family history includes Heart attack in her mother; Hypertension in her mother; Lymphoma in her mother. SOCIAL HISTORY She  reports that she quit smoking about 28 years ago. she has never used smokeless tobacco. She reports that she does not drink alcohol or use drugs.   Review of Systems: Review of Systems  Constitutional: Negative.   HENT: Positive for hearing loss. Negative for congestion, ear discharge, ear pain, nosebleeds, sore throat and tinnitus.   Eyes: Negative.   Respiratory: Negative for cough, hemoptysis, sputum production, shortness of breath, wheezing and stridor.   Cardiovascular: Negative.  Negative for chest pain, palpitations and leg swelling.  Gastrointestinal: Positive for heartburn (has been trying to get off prilosec). Negative for abdominal pain, blood in stool,  constipation, diarrhea, melena, nausea and vomiting.  Genitourinary: Negative.   Musculoskeletal: Positive for joint pain (left knee). Negative for back pain, falls, myalgias and neck pain.  Skin: Negative.   Neurological: Negative.  Negative for headaches.  Psychiatric/Behavioral: Negative for depression, hallucinations, memory loss, substance abuse and suicidal ideas. The patient is not nervous/anxious and does not have insomnia.      Physical Exam: Estimated body mass index is 31.28 kg/m as calculated from the following:   Height as of this encounter: 5\' 6"  (1.676 m).   Weight as of this encounter: 193 lb 12.8 oz (87.9 kg). BP 120/66   Pulse 68   Temp 97.9 F (36.6 C)   Resp 14   Ht 5\' 6"  (1.676 m)   Wt 193 lb 12.8 oz (87.9 kg)   SpO2 96%   BMI 31.28 kg/m  General Appearance: Well nourished, in no apparent distress.  Eyes: PERRLA, EOMs, conjunctiva no swelling or erythema, normal fundi and vessels.  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 Tonsils not swollen or erythematous. Hearing impaired, has hearing aids Neck: Supple, thyroid normal. No bruits  Respiratory: Respiratory effort normal,  without wheezing, rales, rhonchi,or stridor.  Cardio: RRR without murmurs, rubs or gallops. Brisk peripheral pulses without edema.  Chest: symmetric, with normal excursions and percussion.  Breasts: defer Abdomen: Soft, nontender,  no guarding, rebound, hernias, masses, or organomegaly. .  Lymphatics: Non tender without lymphadenopathy.  Genitourinary: defer Musculoskeletal: Full ROM all peripheral extremities,5/5 strength, and normal antalgic Skin: several seb derm keratosis, skin tag left axilla. Warm, dry without rashes, lesions, ecchymosis. Neuro: Cranial nerves intact, reflexes equal bilaterally. Normal  muscle tone, no cerebellar symptoms. Sensation intact.  Psych: Awake and oriented X 3, normal affect, Insight and Judgment appropriate.    12:21 PM [facility]

## 2017-08-07 ENCOUNTER — Ambulatory Visit: Payer: Medicare Other | Admitting: Physician Assistant

## 2017-08-07 ENCOUNTER — Encounter: Payer: Self-pay | Admitting: Physician Assistant

## 2017-08-07 VITALS — BP 120/66 | HR 68 | Temp 97.9°F | Resp 14 | Ht 66.0 in | Wt 193.8 lb

## 2017-08-07 DIAGNOSIS — Z0000 Encounter for general adult medical examination without abnormal findings: Secondary | ICD-10-CM

## 2017-08-07 DIAGNOSIS — I10 Essential (primary) hypertension: Secondary | ICD-10-CM | POA: Diagnosis not present

## 2017-08-07 DIAGNOSIS — Z6831 Body mass index (BMI) 31.0-31.9, adult: Secondary | ICD-10-CM

## 2017-08-07 DIAGNOSIS — J449 Chronic obstructive pulmonary disease, unspecified: Secondary | ICD-10-CM

## 2017-08-07 DIAGNOSIS — E669 Obesity, unspecified: Secondary | ICD-10-CM

## 2017-08-07 DIAGNOSIS — E039 Hypothyroidism, unspecified: Secondary | ICD-10-CM

## 2017-08-07 DIAGNOSIS — Z136 Encounter for screening for cardiovascular disorders: Secondary | ICD-10-CM | POA: Diagnosis not present

## 2017-08-07 DIAGNOSIS — H906 Mixed conductive and sensorineural hearing loss, bilateral: Secondary | ICD-10-CM

## 2017-08-07 DIAGNOSIS — E782 Mixed hyperlipidemia: Secondary | ICD-10-CM

## 2017-08-07 DIAGNOSIS — M069 Rheumatoid arthritis, unspecified: Secondary | ICD-10-CM

## 2017-08-07 DIAGNOSIS — R7309 Other abnormal glucose: Secondary | ICD-10-CM

## 2017-08-07 DIAGNOSIS — R8271 Bacteriuria: Secondary | ICD-10-CM

## 2017-08-07 DIAGNOSIS — N182 Chronic kidney disease, stage 2 (mild): Secondary | ICD-10-CM

## 2017-08-07 DIAGNOSIS — E559 Vitamin D deficiency, unspecified: Secondary | ICD-10-CM

## 2017-08-07 DIAGNOSIS — F325 Major depressive disorder, single episode, in full remission: Secondary | ICD-10-CM

## 2017-08-07 DIAGNOSIS — Z79899 Other long term (current) drug therapy: Secondary | ICD-10-CM

## 2017-08-07 NOTE — Patient Instructions (Addendum)
Can do miralax daily Add on bulk agent such as benefiber or citracel- do daily Use Milk of Magnesia AS NEEDED  Benefiber or Citracel is good for constipation/diarrhea/irritable bowel syndrome, it helps with weight loss and can help lower your bad cholesterol. Please do 1 TBSP in the morning in water, coffee, or tea. It can take up to a month before you can see a difference with your bowel movements. It is cheapest from [employer], [employer], [employer].    Constipation, Adult Constipation is when a person has fewer bowel movements in a week than normal, has difficulty having a bowel movement, or has stools that are dry, hard, or larger than normal. Constipation may be caused by an underlying condition. It may become worse with age if a person takes certain medicines and does not take in enough fluids. Follow these instructions at home: Eating and drinking   Eat foods that have a lot of fiber, such as fresh fruits and vegetables, whole grains, and beans.  Limit foods that are high in fat, low in fiber, or overly processed, such as french fries, hamburgers, cookies, candies, and soda.  Drink enough fluid to keep your urine clear or pale yellow. General instructions  Exercise regularly or as told by your health care provider.  Go to the restroom when you have the urge to go. Do not hold it in.  Take over-the-counter and prescription medicines only as told by your health care provider. These include any fiber supplements.  Practice pelvic floor retraining exercises, such as deep breathing while relaxing the lower abdomen and pelvic floor relaxation during bowel movements.  Watch your condition for any changes.  Keep all follow-up visits as told by your health care provider. This is important. Contact a health care provider if:  You have pain that gets worse.  You have a fever.  You do not have a bowel movement after 4 days.  You vomit.  You are not hungry.  You lose weight.  You are  bleeding from the anus.  You have thin, pencil-like stools. Get help right away if:  You have a fever and your symptoms suddenly get worse.  You leak stool or have blood in your stool.  Your abdomen is bloated.  You have severe pain in your abdomen.  You feel dizzy or you faint. This information is not intended to replace advice given to you by your health care provider. Make sure you discuss any questions you have with your health care provider. Document Released: 02/11/2004 Document Revised: 12/03/2015 Document Reviewed: 11/03/2015 Elsevier Interactive Patient Education  2018 [employer]  Seborrheic Keratosis Seborrheic keratosis is a common, noncancerous (benign) skin growth. This condition causes waxy, rough, tan, brown, or black spots to appear on the skin. These skin growths can be flat or raised. What are the causes? The cause of this condition is not known. What increases the risk? This condition is more likely to develop in:  People who have a family history of seborrheic keratosis.  People who are 51 or older.  People who are pregnant.  People who have had estrogen replacement therapy.  What are the signs or symptoms? This condition often occurs on the face, chest, shoulders, back, or other areas. These growths:  Are usually painless, but may become irritated and itchy.  Can be yellow, brown, black, or other colors.  Are slightly raised or have a flat surface.  Are sometimes rough or wart-like in texture.  Are often waxy on the surface.  Are round or oval-shaped.  Sometimes look like they are "stuck on."  Often occur in groups, but may occur as a single growth.  How is this diagnosed? This condition is diagnosed with a medical history and physical exam. A sample of the growth may be tested (skin biopsy). You may need to see a skin specialist (dermatologist). How is this treated? Treatment is not usually needed for this condition, unless the growths  are irritated or are often bleeding. You may also choose to have the growths removed if you do not like their appearance. Most commonly, these growths are treated with a procedure in which liquid nitrogen is applied to "freeze" off the growth (cryosurgery). They may also be burned off with electricity or cut off. Follow these instructions at home:  Watch your growth for any changes.  Keep all follow-up visits as told by your health care provider. This is important.  Do not scratch or pick at the growth or growths. This can cause them to become irritated or infected. Contact a health care provider if:  You suddenly have many new growths.  Your growth bleeds, itches, or hurts.  Your growth suddenly becomes larger or changes color. This information is not intended to replace advice given to you by your health care provider. Make sure you discuss any questions you have with your health care provider. Document Released: 06/17/2010 Document Revised: 10/21/2015 Document Reviewed: 09/30/2014 Elsevier Interactive Patient Education  2018 [employer].

## 2017-08-08 ENCOUNTER — Other Ambulatory Visit: Payer: Self-pay

## 2017-08-08 DIAGNOSIS — R8271 Bacteriuria: Secondary | ICD-10-CM

## 2017-08-08 NOTE — Addendum Note (Signed)
Addended by: Emerson Monte on: 08/08/2017 05:05 PM   Modules accepted: Orders

## 2017-08-09 LAB — URINALYSIS, ROUTINE W REFLEX MICROSCOPIC
Bilirubin Urine: NEGATIVE
GLUCOSE, UA: NEGATIVE
Hgb urine dipstick: NEGATIVE
Hyaline Cast: NONE SEEN /LPF
KETONES UR: NEGATIVE
Nitrite: POSITIVE — AB
PROTEIN: NEGATIVE
RBC / HPF: NONE SEEN /HPF (ref 0–2)
SPECIFIC GRAVITY, URINE: 1.013 (ref 1.001–1.03)
pH: 6.5 (ref 5.0–8.0)

## 2017-08-09 LAB — HEPATIC FUNCTION PANEL
AG RATIO: 1.4 (calc) (ref 1.0–2.5)
ALBUMIN MSPROF: 4.1 g/dL (ref 3.6–5.1)
ALT: 23 U/L (ref 6–29)
AST: 19 U/L (ref 10–35)
Alkaline phosphatase (APISO): 86 U/L (ref 33–130)
Bilirubin, Direct: 0.1 mg/dL (ref 0.0–0.2)
GLOBULIN: 2.9 g/dL (ref 1.9–3.7)
Indirect Bilirubin: 0.4 mg/dL (calc) (ref 0.2–1.2)
Total Bilirubin: 0.5 mg/dL (ref 0.2–1.2)
Total Protein: 7 g/dL (ref 6.1–8.1)

## 2017-08-09 LAB — LIPID PANEL
CHOL/HDL RATIO: 3.1 (calc) (ref <5.0)
CHOLESTEROL: 185 mg/dL (ref <200)
HDL: 59 mg/dL (ref >50)
LDL CHOLESTEROL (CALC): 107 mg/dL — AB
Non-HDL Cholesterol (Calc): 126 mg/dL (calc) (ref <130)
TRIGLYCERIDES: 99 mg/dL (ref <150)

## 2017-08-09 LAB — HEMOGLOBIN A1C
Hgb A1c MFr Bld: 5.9 % of total Hgb — ABNORMAL HIGH (ref <5.7)
Mean Plasma Glucose: 123 (calc)
eAG (mmol/L): 6.8 (calc)

## 2017-08-09 LAB — CBC WITH DIFFERENTIAL/PLATELET
BASOS ABS: 78 {cells}/uL (ref 0–200)
Basophils Relative: 1.2 %
EOS ABS: 202 {cells}/uL (ref 15–500)
EOS PCT: 3.1 %
HEMATOCRIT: 43 % (ref 35.0–45.0)
HEMOGLOBIN: 14.2 g/dL (ref 11.7–15.5)
LYMPHS ABS: 2880 {cells}/uL (ref 850–3900)
MCH: 31.1 pg (ref 27.0–33.0)
MCHC: 33 g/dL (ref 32.0–36.0)
MCV: 94.3 fL (ref 80.0–100.0)
MPV: 10.1 fL (ref 7.5–12.5)
Monocytes Relative: 7.6 %
NEUTROS ABS: 2847 {cells}/uL (ref 1500–7800)
NEUTROS PCT: 43.8 %
Platelets: 278 10*3/uL (ref 140–400)
RBC: 4.56 10*6/uL (ref 3.80–5.10)
RDW: 16.1 % — AB (ref 11.0–15.0)
Total Lymphocyte: 44.3 %
WBC: 6.5 10*3/uL (ref 3.8–10.8)
WBCMIX: 494 {cells}/uL (ref 200–950)

## 2017-08-09 LAB — BASIC METABOLIC PANEL WITH GFR
BUN: 18 mg/dL (ref 7–25)
CALCIUM: 9.6 mg/dL (ref 8.6–10.4)
CO2: 32 mmol/L (ref 20–32)
CREATININE: 0.93 mg/dL (ref 0.60–0.93)
Chloride: 100 mmol/L (ref 98–110)
GFR, Est African American: 68 mL/min/{1.73_m2} (ref >=60)
GFR, Est Non African American: 59 mL/min/{1.73_m2} — ABNORMAL LOW (ref >=60)
GLUCOSE: 91 mg/dL (ref 65–99)
Potassium: 4.1 mmol/L (ref 3.5–5.3)
Sodium: 139 mmol/L (ref 135–146)

## 2017-08-09 LAB — MAGNESIUM: Magnesium: 1.8 mg/dL (ref 1.5–2.5)

## 2017-08-09 LAB — MICROALBUMIN / CREATININE URINE RATIO
Creatinine, Urine: 60 mg/dL (ref 20–275)
MICROALB UR: 1 mg/dL
Microalb Creat Ratio: 17 mcg/mg creat (ref <30)

## 2017-08-09 LAB — TSH: TSH: 2.5 m[IU]/L (ref 0.40–4.50)

## 2017-08-09 LAB — VITAMIN D 25 HYDROXY (VIT D DEFICIENCY, FRACTURES): Vit D, 25-Hydroxy: 81 ng/mL (ref 30–100)

## 2017-08-10 ENCOUNTER — Other Ambulatory Visit: Payer: Self-pay | Admitting: Physician Assistant

## 2017-08-10 LAB — URINE CULTURE
MICRO NUMBER:: 90320775
SPECIMEN QUALITY:: ADEQUATE

## 2017-08-10 MED ORDER — CEFUROXIME AXETIL 250 MG PO TABS: 250.0000 mg | ORAL_TABLET | Freq: Two times a day (BID) | ORAL | 0 refills | Status: AC

## 2017-08-21 ENCOUNTER — Other Ambulatory Visit: Payer: Self-pay | Admitting: Adult Health

## 2017-08-29 ENCOUNTER — Other Ambulatory Visit: Payer: Self-pay | Admitting: Physician Assistant

## 2017-08-29 ENCOUNTER — Other Ambulatory Visit: Payer: Self-pay | Admitting: Adult Health

## 2017-09-18 ENCOUNTER — Ambulatory Visit: Payer: Medicare Other

## 2017-09-18 VITALS — Ht 66.0 in | Wt 198.0 lb

## 2017-09-18 DIAGNOSIS — R8271 Bacteriuria: Secondary | ICD-10-CM

## 2017-09-18 NOTE — Addendum Note (Signed)
Addended by: Caryl Asp on: 09/18/2017 10:02 AM   Modules accepted: Orders

## 2017-09-18 NOTE — Progress Notes (Signed)
Pt presents for U/A & culture. Pt has finished ABX & reports no concerns at this time.

## 2017-09-24 LAB — URINALYSIS, ROUTINE W REFLEX MICROSCOPIC
BILIRUBIN URINE: NEGATIVE
Bacteria, UA: NONE SEEN /HPF
Glucose, UA: NEGATIVE
HGB URINE DIPSTICK: NEGATIVE
Hyaline Cast: NONE SEEN /LPF
KETONES UR: NEGATIVE
NITRITE: NEGATIVE
Protein, ur: NEGATIVE
SPECIFIC GRAVITY, URINE: 1.015 (ref 1.001–1.03)

## 2017-09-24 LAB — URINE CULTURE
MICRO NUMBER: 90496842
SPECIMEN QUALITY:: ADEQUATE

## 2017-09-25 ENCOUNTER — Other Ambulatory Visit: Payer: Self-pay | Admitting: Physician Assistant

## 2017-09-25 DIAGNOSIS — T7840XS Allergy, unspecified, sequela: Secondary | ICD-10-CM

## 2017-09-25 DIAGNOSIS — R059 Cough, unspecified: Secondary | ICD-10-CM

## 2017-09-25 DIAGNOSIS — R05 Cough: Secondary | ICD-10-CM

## 2017-12-01 ENCOUNTER — Other Ambulatory Visit: Payer: Self-pay | Admitting: Adult Health

## 2017-12-09 ENCOUNTER — Other Ambulatory Visit: Payer: Self-pay

## 2017-12-09 ENCOUNTER — Emergency Department (HOSPITAL_COMMUNITY): Payer: Medicare Other

## 2017-12-09 ENCOUNTER — Encounter (HOSPITAL_COMMUNITY): Payer: Self-pay

## 2017-12-09 DIAGNOSIS — Z79890 Hormone replacement therapy (postmenopausal): Secondary | ICD-10-CM

## 2017-12-09 DIAGNOSIS — R570 Cardiogenic shock: Secondary | ICD-10-CM | POA: Diagnosis not present

## 2017-12-09 DIAGNOSIS — R739 Hyperglycemia, unspecified: Secondary | ICD-10-CM | POA: Diagnosis present

## 2017-12-09 DIAGNOSIS — E785 Hyperlipidemia, unspecified: Secondary | ICD-10-CM | POA: Diagnosis present

## 2017-12-09 DIAGNOSIS — R7303 Prediabetes: Secondary | ICD-10-CM | POA: Diagnosis present

## 2017-12-09 DIAGNOSIS — J449 Chronic obstructive pulmonary disease, unspecified: Secondary | ICD-10-CM | POA: Diagnosis present

## 2017-12-09 DIAGNOSIS — N179 Acute kidney failure, unspecified: Secondary | ICD-10-CM | POA: Diagnosis not present

## 2017-12-09 DIAGNOSIS — Z7982 Long term (current) use of aspirin: Secondary | ICD-10-CM

## 2017-12-09 DIAGNOSIS — Z888 Allergy status to other drugs, medicaments and biological substances status: Secondary | ICD-10-CM

## 2017-12-09 DIAGNOSIS — I469 Cardiac arrest, cause unspecified: Secondary | ICD-10-CM

## 2017-12-09 DIAGNOSIS — R911 Solitary pulmonary nodule: Secondary | ICD-10-CM | POA: Diagnosis present

## 2017-12-09 DIAGNOSIS — Z87891 Personal history of nicotine dependence: Secondary | ICD-10-CM

## 2017-12-09 DIAGNOSIS — N182 Chronic kidney disease, stage 2 (mild): Secondary | ICD-10-CM | POA: Diagnosis present

## 2017-12-09 DIAGNOSIS — G9340 Encephalopathy, unspecified: Secondary | ICD-10-CM | POA: Diagnosis not present

## 2017-12-09 DIAGNOSIS — Z79899 Other long term (current) drug therapy: Secondary | ICD-10-CM

## 2017-12-09 DIAGNOSIS — Z452 Encounter for adjustment and management of vascular access device: Secondary | ICD-10-CM

## 2017-12-09 DIAGNOSIS — I2510 Atherosclerotic heart disease of native coronary artery without angina pectoris: Secondary | ICD-10-CM | POA: Diagnosis present

## 2017-12-09 DIAGNOSIS — J9600 Acute respiratory failure, unspecified whether with hypoxia or hypercapnia: Secondary | ICD-10-CM

## 2017-12-09 DIAGNOSIS — I129 Hypertensive chronic kidney disease with stage 1 through stage 4 chronic kidney disease, or unspecified chronic kidney disease: Secondary | ICD-10-CM | POA: Diagnosis present

## 2017-12-09 DIAGNOSIS — E876 Hypokalemia: Secondary | ICD-10-CM | POA: Diagnosis present

## 2017-12-09 DIAGNOSIS — I214 Non-ST elevation (NSTEMI) myocardial infarction: Secondary | ICD-10-CM | POA: Diagnosis not present

## 2017-12-09 DIAGNOSIS — Z887 Allergy status to serum and vaccine status: Secondary | ICD-10-CM

## 2017-12-09 DIAGNOSIS — B9620 Unspecified Escherichia coli [E. coli] as the cause of diseases classified elsewhere: Secondary | ICD-10-CM | POA: Diagnosis present

## 2017-12-09 DIAGNOSIS — I200 Unstable angina: Secondary | ICD-10-CM | POA: Diagnosis not present

## 2017-12-09 DIAGNOSIS — J9601 Acute respiratory failure with hypoxia: Secondary | ICD-10-CM | POA: Diagnosis not present

## 2017-12-09 DIAGNOSIS — M069 Rheumatoid arthritis, unspecified: Secondary | ICD-10-CM | POA: Diagnosis present

## 2017-12-09 DIAGNOSIS — N390 Urinary tract infection, site not specified: Secondary | ICD-10-CM | POA: Diagnosis present

## 2017-12-09 DIAGNOSIS — Z978 Presence of other specified devices: Secondary | ICD-10-CM

## 2017-12-09 DIAGNOSIS — E559 Vitamin D deficiency, unspecified: Secondary | ICD-10-CM | POA: Diagnosis present

## 2017-12-09 DIAGNOSIS — I10 Essential (primary) hypertension: Secondary | ICD-10-CM | POA: Diagnosis present

## 2017-12-09 DIAGNOSIS — R579 Shock, unspecified: Secondary | ICD-10-CM

## 2017-12-09 DIAGNOSIS — R001 Bradycardia, unspecified: Secondary | ICD-10-CM | POA: Diagnosis not present

## 2017-12-09 DIAGNOSIS — Z66 Do not resuscitate: Secondary | ICD-10-CM | POA: Diagnosis present

## 2017-12-09 DIAGNOSIS — I4891 Unspecified atrial fibrillation: Secondary | ICD-10-CM | POA: Diagnosis not present

## 2017-12-09 DIAGNOSIS — E872 Acidosis: Secondary | ICD-10-CM | POA: Diagnosis not present

## 2017-12-09 LAB — BASIC METABOLIC PANEL
Anion gap: 13 (ref 5–15)
BUN: 15 mg/dL (ref 8–23)
CO2: 25 mmol/L (ref 22–32)
CREATININE: 0.93 mg/dL (ref 0.44–1.00)
Calcium: 9.3 mg/dL (ref 8.9–10.3)
Chloride: 98 mmol/L (ref 98–111)
GFR calc Af Amer: >60 mL/min (ref >60)
GFR calc non Af Amer: 57 mL/min — ABNORMAL LOW (ref >60)
GLUCOSE: 179 mg/dL — AB (ref 70–99)
Potassium: 3.5 mmol/L (ref 3.5–5.1)
Sodium: 136 mmol/L (ref 135–145)

## 2017-12-09 LAB — CBC
HEMATOCRIT: 42.6 % (ref 36.0–46.0)
Hemoglobin: 14 g/dL (ref 12.0–15.0)
MCH: 30.6 pg (ref 26.0–34.0)
MCHC: 32.9 g/dL (ref 30.0–36.0)
MCV: 93.2 fL (ref 78.0–100.0)
PLATELETS: 246 10*3/uL (ref 150–400)
RBC: 4.57 MIL/uL (ref 3.87–5.11)
RDW: 14.4 % (ref 11.5–15.5)
WBC: 7.2 10*3/uL (ref 4.0–10.5)

## 2017-12-09 LAB — HEPARIN LEVEL (UNFRACTIONATED): HEPARIN UNFRACTIONATED: 0.53 [IU]/mL (ref 0.30–0.70)

## 2017-12-09 LAB — TROPONIN I
Troponin I: 0.37 ng/mL (ref <0.03)
Troponin I: 3.96 ng/mL (ref <0.03)

## 2017-12-09 LAB — I-STAT TROPONIN, ED: TROPONIN I, POC: 0.24 ng/mL — AB (ref 0.00–0.08)

## 2017-12-09 LAB — MRSA PCR SCREENING: MRSA by PCR: NEGATIVE

## 2017-12-09 MED ORDER — MORPHINE SULFATE (PF) 4 MG/ML IV SOLN
2.0000 mg | Freq: Once | INTRAVENOUS | Status: AC
  Administered 2017-12-09: 2 mg via INTRAVENOUS
  Filled 2017-12-09: qty 1

## 2017-12-09 MED ORDER — HEPARIN BOLUS VIA INFUSION
4000.0000 [IU] | Freq: Once | INTRAVENOUS | Status: AC
Start: 2017-12-09 — End: 2017-12-09
  Administered 2017-12-09: 4000 [IU] via INTRAVENOUS
  Filled 2017-12-09: qty 4000

## 2017-12-09 MED ORDER — CITALOPRAM HYDROBROMIDE 20 MG PO TABS
40.0000 mg | ORAL_TABLET | Freq: Every day | ORAL | Status: DC
  Administered 2017-12-09 – 2017-12-10 (×2): 40 mg via ORAL
  Filled 2017-12-09 (×2): qty 2

## 2017-12-09 MED ORDER — SODIUM CHLORIDE 0.9 % WEIGHT BASED INFUSION: 1.0000 mL/kg/h | INTRAVENOUS | Status: DC

## 2017-12-09 MED ORDER — IOPAMIDOL (ISOVUE-370) INJECTION 76%
INTRAVENOUS | Status: AC
  Filled 2017-12-09: qty 100

## 2017-12-09 MED ORDER — SODIUM CHLORIDE 0.9 % IV SOLN: 250.0000 mL | INTRAVENOUS | Status: DC | PRN

## 2017-12-09 MED ORDER — ONDANSETRON HCL 4 MG/2ML IJ SOLN
4.0000 mg | Freq: Four times a day (QID) | INTRAMUSCULAR | Status: DC | PRN
  Administered 2017-12-09 – 2017-12-10 (×3): 4 mg via INTRAVENOUS
  Filled 2017-12-09 (×3): qty 2

## 2017-12-09 MED ORDER — SODIUM CHLORIDE 0.9 % WEIGHT BASED INFUSION
3.0000 mL/kg/h | INTRAVENOUS | Status: AC
  Administered 2017-12-10: 3 mL/kg/h via INTRAVENOUS

## 2017-12-09 MED ORDER — LEVOTHYROXINE SODIUM 112 MCG PO TABS
112.0000 ug | ORAL_TABLET | Freq: Every day | ORAL | Status: DC
  Administered 2017-12-10 – 2017-12-11 (×2): 112 ug via ORAL
  Filled 2017-12-09 (×2): qty 1

## 2017-12-09 MED ORDER — MORPHINE SULFATE (PF) 2 MG/ML IV SOLN
1.0000 mg | INTRAVENOUS | Status: DC | PRN
  Administered 2017-12-09 – 2017-12-10 (×5): 1 mg via INTRAVENOUS
  Filled 2017-12-09 (×6): qty 1

## 2017-12-09 MED ORDER — ASPIRIN 81 MG PO CHEW
81.0000 mg | CHEWABLE_TABLET | ORAL | Status: AC
  Administered 2017-12-10: 81 mg via ORAL
  Filled 2017-12-09: qty 1

## 2017-12-09 MED ORDER — NITROGLYCERIN 0.4 MG SL SUBL
0.4000 mg | SUBLINGUAL_TABLET | SUBLINGUAL | Status: DC | PRN
  Filled 2017-12-09: qty 1

## 2017-12-09 MED ORDER — SODIUM CHLORIDE 0.9% FLUSH: 3.0000 mL | INTRAVENOUS | Status: DC | PRN

## 2017-12-09 MED ORDER — PNEUMOCOCCAL VAC POLYVALENT 25 MCG/0.5ML IJ INJ: 0.5000 mL | INJECTION | INTRAMUSCULAR | Status: DC

## 2017-12-09 MED ORDER — IOPAMIDOL (ISOVUE-370) INJECTION 76%
100.0000 mL | Freq: Once | INTRAVENOUS | Status: AC | PRN
  Administered 2017-12-09: 100 mL via INTRAVENOUS

## 2017-12-09 MED ORDER — SODIUM CHLORIDE 0.9% FLUSH
3.0000 mL | Freq: Two times a day (BID) | INTRAVENOUS | Status: DC
  Administered 2017-12-09 – 2017-12-10 (×2): 3 mL via INTRAVENOUS

## 2017-12-09 MED ORDER — NITROGLYCERIN IN D5W 200-5 MCG/ML-% IV SOLN
0.0000 ug/min | Freq: Once | INTRAVENOUS | Status: AC
  Administered 2017-12-09: 5 ug/min via INTRAVENOUS
  Filled 2017-12-09: qty 250

## 2017-12-09 MED ORDER — ATORVASTATIN CALCIUM 40 MG PO TABS
40.0000 mg | ORAL_TABLET | Freq: Every day | ORAL | Status: DC
  Administered 2017-12-09: 40 mg via ORAL
  Filled 2017-12-09: qty 1

## 2017-12-09 MED ORDER — ASPIRIN EC 81 MG PO TBEC: 81.0000 mg | DELAYED_RELEASE_TABLET | Freq: Every day | ORAL | Status: DC

## 2017-12-09 MED ORDER — MONTELUKAST SODIUM 10 MG PO TABS
10.0000 mg | ORAL_TABLET | Freq: Every day | ORAL | Status: DC
  Administered 2017-12-09 – 2017-12-10 (×2): 10 mg via ORAL
  Filled 2017-12-09 (×2): qty 1

## 2017-12-09 MED ORDER — NITROGLYCERIN IN D5W 200-5 MCG/ML-% IV SOLN
0.0000 ug/min | INTRAVENOUS | Status: DC
  Administered 2017-12-09: 5 ug/min via INTRAVENOUS
  Filled 2017-12-09: qty 250

## 2017-12-09 MED ORDER — ACETAMINOPHEN 325 MG PO TABS
650.0000 mg | ORAL_TABLET | ORAL | Status: DC | PRN
  Administered 2017-12-10: 650 mg via ORAL
  Filled 2017-12-09: qty 2

## 2017-12-09 MED ORDER — HEPARIN (PORCINE) IN NACL 100-0.45 UNIT/ML-% IJ SOLN
950.0000 [IU]/h | INTRAMUSCULAR | Status: DC
  Administered 2017-12-09: 950 [IU]/h via INTRAVENOUS
  Filled 2017-12-09 (×2): qty 250

## 2017-12-09 NOTE — ED Notes (Signed)
Notified by [facility] that they cannot accept patient due to ongoing chest pain and nitro infusion. Notified bed control.

## 2017-12-09 NOTE — Progress Notes (Signed)
Pt complaining of chest pain. Says nothing given to her has stopped her pain. Provider paged and made aware. Per provider verbal order for morphine 1mg  and restart pt on nitro drip. This RN will continue to monitor pt closely.

## 2017-12-09 NOTE — ED Provider Notes (Signed)
[facility] EMERGENCY DEPARTMENT Provider Note   CSN: [id number] Arrival date & time: 12/06/2017  1000     History   Chief Complaint Chief Complaint  Patient presents with  . Chest Pain    HPI DECARLA SIEMEN is a 77 y.o. female.  77 year old female presents with acute onset of chest pressure which started about 9/2 hours prior to arrival.  Woke up from sleep and radiates to both arms.  No associated dyspnea or diaphoresis.  She attempted to self medicate with antacids which did not help.  Denies any exertional component to this.  No prior history of heart issues.  Denies any syncope or near syncope.  No neurological symptoms at all.  Called EMS was given aspirin nitro with limited relief.     Past Medical History:  Diagnosis Date  . Allergy   . Cataract 05/04/2017   Bilateral - removed 2018   . Hyperlipidemia   . Hypertension   . Prediabetes   . Rheumatoid arthritis (HCC)   . Thyroid disease   . Vitamin D deficiency     Patient Active Problem List   Diagnosis Date Noted  . 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 Depression, major, in remission (HCC) 10/26/2015  . COPD (chronic obstructive pulmonary disease) (HCC) 10/26/2015  . Other abnormal glucose 05/13/2015  . Obesity (BMI 30.0-34.9) 12/30/2014  . CKD (chronic kidney disease) stage 2, GFR 60-89 ml/min 06/02/2014  . Medication management 07/31/2013  . Hyperlipemia   . HTN (hypertension)   . Hypothyroidism   . Vitamin D deficiency   . Rheumatoid arthritis (HCC)   . 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 ANKLE ARTHROSCOPY WITH OPEN REDUCTION INTERNAL FIXATION (ORIF) Right 2004  . APPENDECTOMY    . CATARACT EXTRACTION, BILATERAL Bilateral 02/2017   Dr. Adine Madura  . CHOLECYSTECTOMY    . COCHLEAR IMPLANT Right 2007  . OTHER SURGICAL HISTORY     4 ear surgeries  .  OTHER SURGICAL HISTORY     Right temple bone surgery  . TUBAL LIGATION Bilateral      OB History   None      Home Medications    Prior to Admission medications   Medication Sig Start Date End Date Taking? Authorizing Provider  Adalimumab (HUMIRA PEN) 40 MG/0.8ML PNKT  11/09/14   [provider]  aspirin EC 81 MG tablet Take 81 mg by mouth.    [provider]  calcium carbonate (OS-CAL) 600 MG TABS tablet Take 600 mg by mouth 2 (two) times daily with a meal.    [provider]  Cholecalciferol (VITAMIN D3) 2000 units capsule Take 4,000 Units by mouth.     [provider]  citalopram (CELEXA) 40 MG tablet TAKE 1 TABLET BY MOUTH EVERY DAY 08/29/17   Quentin Mulling, PA-C  folic acid (FOLVITE) 1 MG tablet Take by mouth daily.    [provider]  glucose blood (FREESTYLE LITE) test strip Test Blood Sugar one time daily-DX-R73.09 02/07/17   Lucky Cowboy, MD  hydrochlorothiazide (HYDRODIURIL) 25 MG tablet TAKE 1 TABLET BY MOUTH EVERY DAY FOR BLOOD PRESSURE AND FLUID RETENTION 12/01/17   Lucky Cowboy, MD  Lancets (ACCU-CHEK SOFT [facility]) lancets Check blood sugar 1 time daily-DX-R73.03 02/14/17   Lucky Cowboy, MD  levothyroxine (SYNTHROID, LEVOTHROID) 112 MCG tablet TAKE 1 TABLET BY MOUTH EVERY MORNING BEFORE BREAKFAST  ON AN EMPTY STOMACH 08/21/17   Lucky Cowboy, MD  losartan (COZAAR) 100 MG tablet TAKE 1 TABLET BY MOUTH EVERY DAY 07/03/17   Judd Gaudier, NP  Magnesium 400 MG CAPS Take 400 mg by mouth 2 (two) times daily.    [provider]  methotrexate (RHEUMATREX) 2.5 MG tablet Take by mouth. Takes 8 pills on Fridays    [provider]  montelukast (SINGULAIR) 10 MG tablet TAKE 1 TABLET(10 MG) BY MOUTH DAILY 09/25/17   Lucky Cowboy, MD  Omega-3 Fatty Acids (FISH OIL PO) Take by mouth 2 (two) times daily.     [provider]  triamcinolone cream (KENALOG) 0.5 % Apply 1 application topically 2 (two) times  daily. Patient taking differently: Apply 1 application topically as needed.  07/20/16   Quentin Mulling, PA-C    Family History Family History  Problem Relation Age of Onset  . Heart attack Mother   . Hypertension Mother   . Lymphoma Mother     Social History Social History   Tobacco Use  . Smoking status: Former Smoker    Last attempt to quit: 09/08/1988    Years since quitting: 29.2  . Smokeless tobacco: Never Used  Substance Use Topics  . Alcohol use: No    Alcohol/week: 0.0 oz  . Drug use: No     Allergies   Ace inhibitors and Pneumococcal vaccine   Review of Systems Review of Systems  All other systems reviewed and are negative.    Physical Exam Updated Vital Signs BP 112/74 (BP Location: Left Arm)   Pulse 61   Temp 97.6 F (36.4 C) (Oral)   Resp (!) 22   SpO2 97%   Physical Exam  Constitutional: She is oriented to person, place, and time. She appears well-developed and well-nourished.  Non-toxic appearance. No distress.  HENT:  Head: Normocephalic and atraumatic.  Eyes: Pupils are equal, round, and reactive to light. Conjunctivae, EOM and lids are normal.  Neck: Normal range of motion. Neck supple. No tracheal deviation present. No thyroid mass present.  Cardiovascular: Normal rate, regular rhythm and normal heart sounds. Exam reveals no gallop.  No murmur heard. Pulmonary/Chest: Effort normal and breath sounds normal. No stridor. No respiratory distress. She has no decreased breath sounds. She has no wheezes. She has no rhonchi. She has no rales.  Abdominal: Soft. Normal appearance and bowel sounds are normal. She exhibits no distension. There is no tenderness. There is no rebound and no CVA tenderness.  Musculoskeletal: Normal range of motion. She exhibits no edema or tenderness.  Neurological: She is alert and oriented to person, place, and time. She has normal strength. No cranial nerve deficit or sensory deficit. GCS eye subscore is 4. GCS verbal  subscore is 5. GCS motor subscore is 6.  Skin: Skin is warm and dry. No abrasion and no rash noted.  Psychiatric: She has a normal mood and affect. Her speech is normal and behavior is normal.  Nursing note and vitals reviewed.    ED Treatments / Results  Labs (all labs ordered are listed, but only abnormal results are displayed) Labs Reviewed  I-STAT TROPONIN, ED - Abnormal; Notable for the following components:      Result Value   Troponin i, poc 0.24 (*)    All other components within normal limits  CBC  BASIC METABOLIC PANEL    EKG EKG Interpretation  Date/Time:  Sunday December 09 2017 10:10:14 EDT Ventricular Rate:  62 PR Interval:  QRS Duration: 92 QT Interval:  483 QTC Calculation: 491 R Axis:   65 Text Interpretation:  Sinus rhythm Atrial premature complex Nonspecific T abnormalities, anterior leads Borderline prolonged QT interval Confirmed by Lorre Nick ([id number]) on 11/27/2017 10:39:12 AM   Radiology No results found.  Procedures Procedures (including critical care time)  Medications Ordered in ED Medications  nitroGLYCERIN 50 mg in dextrose 5 % 250 mL (0.2 mg/mL) infusion (has no administration in time range)     Initial Impression / Assessment and Plan / ED Course  I have reviewed the triage vital signs and the nursing notes.  Pertinent labs & imaging results that were available during my care of the patient were reviewed by me and considered in my medical decision making (see chart for details).     Patient is EKG without acute ischemic changes.  Concern for possible dissection and CT of chest and abdomen negative.  Patient has elevated troponin.  No nitroglycerin as well as given aspirin.  Discussed with Dr. Ladona Ridgel from cardiology who will come and admit the patient  CRITICAL CARE Performed by: Toy Baker Total critical care time: 50 minutes Critical care time was exclusive of separately billable procedures and treating other  patients. Critical care was necessary to treat or prevent imminent or life-threatening deterioration. Critical care was time spent personally by me on the following activities: development of treatment plan with patient and/or surrogate as well as nursing, discussions with consultants, evaluation of patient's response to treatment, examination of patient, obtaining history from patient or surrogate, ordering and performing treatments and interventions, ordering and review of laboratory studies, ordering and review of radiographic studies, pulse oximetry and re-evaluation of patient's condition.   Final Clinical Impressions(s) / ED Diagnoses   Final diagnoses:  None    ED Discharge Orders    None       Lorre Nick, MD 12/23/2017 1318

## 2017-12-09 NOTE — ED Notes (Signed)
Spoke with [place] PA, Cardiology regarding patient nitro infusion. Pt reports no change in pain level despite nitro titration. Plan to lower nitro to 9mcg/min when I start heparin and then d/c and give morphine 2 mg for pain.

## 2017-12-09 NOTE — Progress Notes (Signed)
ANTICOAGULATION CONSULT NOTE - Initial Consult  Pharmacy Consult:  Heparin Indication: chest pain/ACS  Allergies  Allergen Reactions  . Ace Inhibitors Cough    Other reaction(s): Other (See Comments) Made her cough  . Pneumococcal Vaccine     Other reaction(s): Other (See Comments) Caused arm to swell    Patient Measurements: Weight: 200 lb (90.7 kg) Heparin Dosing Weight: 79 kg  Vital Signs: Temp: 97.6 F (36.4 C) (07/14 1018) Temp Source: Oral (07/14 1018) BP: 104/90 (07/14 1315) Pulse Rate: 56 (07/14 1315)  Labs: Recent Labs    12/23/2017 1014  HGB 14.0  HCT 42.6  PLT 246  CREATININE 0.93    Estimated Creatinine Clearance: 56.6 mL/min (by C-G formula based on SCr of 0.93 mg/dL).   Medical History: Past Medical History:  Diagnosis Date  . Allergy   . Cataract 05/04/2017   Bilateral - removed 2018   . Hyperlipidemia   . Hypertension   . Prediabetes   . Rheumatoid arthritis (HCC)   . Thyroid disease   . Vitamin D deficiency       Assessment: 3 YOF presented with chest pain.  CT negative for dissection.  Pharmacy consulted to initiate IV heparin for [place].  Baseline labs and home meds reviewed.   Goal of Therapy:  Heparin level 0.3-0.7 units/ml Monitor platelets by anticoagulation protocol: Yes    Plan:  Heparin 4000 units IV bolus, then Heparin gtt at 950 units/hr Check 8 hr heparin level Daily heparin level and CBC   Angelica Parker, PharmD, BCPS, BCCCP 12/05/2017, 2:04 PM

## 2017-12-09 NOTE — Progress Notes (Signed)
ANTICOAGULATION CONSULT NOTE  Pharmacy Consult:  Heparin Indication: chest pain/ACS  Allergies  Allergen Reactions  . Influenza Vaccines Swelling    Patient states her arms swells up sometimes but she tolerates flu vaccine if given with benadryl  . Ace Inhibitors Cough    Other reaction(s): Other (See Comments) Made her cough    Patient Measurements: Height: 5\' 6"  (167.6 cm) Weight: 200 lb (90.7 kg) IBW/kg (Calculated) : 59.3 Heparin Dosing Weight: 79 kg  Vital Signs: Temp: 98 F (36.7 C) (07/14 2100) Temp Source: Oral (07/14 2100) BP: 138/85 (07/14 2100) Pulse Rate: 101 (07/14 2100)  Labs: Recent Labs    12/12/2017 1014 12/08/2017 1409 12/06/2017 2128  HGB 14.0  --   --   HCT 42.6  --   --   PLT 246  --   --   HEPARINUNFRC  --   --  0.53  CREATININE 0.93  --   --   TROPONINI  --  0.37*  --     Estimated Creatinine Clearance: 56.6 mL/min (by C-G formula based on SCr of 0.93 mg/dL).   Medical History: Past Medical History:  Diagnosis Date  . Allergy   . Cataract 05/04/2017   Bilateral - removed 2018   . Hyperlipidemia   . Hypertension   . Prediabetes   . Rheumatoid arthritis (HCC)   . Thyroid disease   . Vitamin D deficiency     Assessment: 63 YOF presented with chest pain.  CT negative for dissection.  Pharmacy consulted to initiate IV heparin for ACS.   Initial heparin level came back therapeutic at 0.53, after bolus and on 950 units/hr. Hgb stable at 14, plt 246. Trop increasing from 0.24 to 0.37. No s/sx of bleeding. No infusion issues.   Goal of Therapy:  Heparin level 0.3-0.7 units/ml Monitor platelets by anticoagulation protocol: Yes   Plan:  Continue heparin infusion at 950 units/hr Daily heparin level and CBC  70, PharmD Clinical Pharmacist  Pager: [phone] Phone: [phone] 12/16/2017, 10:30 PM

## 2017-12-09 NOTE — H&P (Signed)
Angelica Parker is an 77 y.o. female.   Chief Complaint: chest pain HPI: Angelica Parker is referred by Dr. Zenia Resides for evaluation of chest pain and an abnormal troponin. She is pleasant and otherwise healthy woman with a remote h/o tobacco abuse and a family h/o CAD, who was in her usual state of health until a day ago when she developed midsternal chest pressure and bilateral arm pain. She presented for evaluation and her initial ECG demonstrated NSR with anterior [place] inversion. She still has some mild heaviness. Her initial troponin was elevated at 0.24. She denies palpitations and minimal dyspnea. No prior unusual activity.   Past Medical History:  Diagnosis Date  . Allergy   . Cataract 05/04/2017   Bilateral - removed 2018   . Hyperlipidemia   . Hypertension   . Prediabetes   . Rheumatoid arthritis ([place])   . Thyroid disease   . Vitamin D deficiency     Past Surgical History:  Procedure Laterality Date  . ANKLE ARTHROSCOPY WITH OPEN REDUCTION INTERNAL FIXATION (ORIF) Right 2004  . APPENDECTOMY    . CATARACT EXTRACTION, BILATERAL Bilateral 02/2017   Dr. August Albino  . CHOLECYSTECTOMY    . COCHLEAR IMPLANT Right 2007  . OTHER SURGICAL HISTORY     4 ear surgeries  . OTHER SURGICAL HISTORY     Right temple bone surgery  . TUBAL LIGATION Bilateral     Family History  Problem Relation Age of Onset  . Heart attack Mother   . Hypertension Mother   . Lymphoma Mother    Social History:  reports that she quit smoking about 29 years ago. She has never used smokeless tobacco. She reports that she does not drink alcohol or use drugs.  Allergies:  Allergies  Allergen Reactions  . Ace Inhibitors Cough    Other reaction(s): Other (See Comments) Made her cough  . Pneumococcal Vaccine     Other reaction(s): Other (See Comments) Caused arm to swell     (Not in a hospital admission)  Results for orders placed or performed during the hospital encounter of 12/25/2017 (from the past  48 hour(s))  Basic metabolic panel     Status: Abnormal   Collection Time: 12/16/2017 10:14 AM  Result Value Ref Range   Sodium 136 135 - 145 mmol/L   Potassium 3.5 3.5 - 5.1 mmol/L   Chloride 98 98 - 111 mmol/L    Comment: Please note change in reference range.   CO2 25 22 - 32 mmol/L   Glucose, Bld 179 (H) 70 - 99 mg/dL    Comment: Please note change in reference range.   BUN 15 8 - 23 mg/dL    Comment: Please note change in reference range.   Creatinine, Ser 0.93 0.44 - 1.00 mg/dL   Calcium 9.3 8.9 - 10.3 mg/dL   GFR calc non Af Amer 57 (L) >60 mL/min   GFR calc Af Amer >60 >60 mL/min    Comment: (NOTE) The eGFR has been calculated using the CKD EPI equation. This calculation has not been validated in all clinical situations. eGFR's persistently <60 mL/min signify possible Chronic Kidney Disease.    Anion gap 13 5 - 15    Comment: Performed at [facility] [address], [place] [address]  CBC     Status: None   Collection Time: 12/12/2017 10:14 AM  Result Value Ref Range   WBC 7.2 4.0 - 10.5 K/uL   RBC 4.57 3.87 -  5.11 MIL/uL   Hemoglobin 14.0 12.0 - 15.0 g/dL   HCT 42.6 36.0 - 46.0 %   MCV 93.2 78.0 - 100.0 fL   MCH 30.6 26.0 - 34.0 pg   MCHC 32.9 30.0 - 36.0 g/dL   RDW 14.4 11.5 - 15.5 %   Platelets 246 150 - 400 K/uL    Comment: Performed at [facility], [place] [address] [place], [place] [address]  I-stat troponin, ED     Status: Abnormal   Collection Time: 12/19/2017 10:22 AM  Result Value Ref Range   Troponin i, poc 0.24 (HH) 0.00 - 0.08 ng/mL   Comment NOTIFIED PHYSICIAN    Comment 3            Comment: Due to the release kinetics of cTnI, a negative result within the first hours of the onset of symptoms does not rule out myocardial infarction with certainty. If myocardial infarction is still suspected, repeat the test at appropriate intervals.    Dg Chest 2 View  Result Date: 12/20/2017 CLINICAL DATA:  77 year old female with history  of chest pain with radiation into the arms bilaterally. EXAM: CHEST - 2 VIEW COMPARISON:  Chest x-ray 06/02/2014. FINDINGS: Lung volumes are normal. No consolidative airspace disease. No pleural effusions. No pneumothorax. No pulmonary nodule or mass noted. Pulmonary vasculature and the cardiomediastinal silhouette are within normal limits. Aortic atherosclerosis IMPRESSION: 1.  No radiographic evidence of acute cardiopulmonary disease. 2. Aortic atherosclerosis. Electronically Signed   By: Vinnie Langton M.D.   On: 11/29/2017 10:40   Ct Angio Chest/abd/pel For Dissection W And/or W/wo  Result Date: 12/23/2017 CLINICAL DATA:  77 year old female with history of chest pain and radiating into the arms bilaterally since 1 a.m. this morning. EXAM: CT ANGIOGRAPHY CHEST, ABDOMEN AND PELVIS TECHNIQUE: Multidetector CT imaging through the chest, abdomen and pelvis was performed using the standard protocol during bolus administration of intravenous contrast. Multiplanar reconstructed images and MIPs were obtained and reviewed to evaluate the vascular anatomy. CONTRAST:  159m ISOVUE-370 IOPAMIDOL (ISOVUE-370) INJECTION 76% COMPARISON:  No priors. FINDINGS: CTA CHEST FINDINGS Cardiovascular: No crescentic high attenuation noted associated with the thoracic aorta to suggest acute intramural hemorrhage. No evidence of thoracic aortic aneurysm or dissection on CTA images. Heart size is normal. There is no significant pericardial fluid, thickening or pericardial calcification. There is aortic atherosclerosis, as well as atherosclerosis of the great vessels of the mediastinum and the coronary arteries, including calcified atherosclerotic plaque in the left main, left anterior descending, left circumflex and right coronary arteries. Mild calcifications of the aortic valve. Mediastinum/Nodes: No pathologically enlarged mediastinal or hilar lymph nodes. Small hiatal hernia. No axillary lymphadenopathy. Lungs/Pleura: 11 x 9 mm  right upper lobe nodule (axial image 60 of series 9) with slightly ill-defined margins. No acute consolidative airspace disease. No pleural effusions. Linear areas of scarring and/or atelectasis in the anterior aspect of the left lower lobe and medial segment of the right middle lobe. Musculoskeletal: There are no aggressive appearing lytic or blastic lesions noted in the visualized portions of the skeleton. Review of the MIP images confirms the above findings. CTA ABDOMEN AND PELVIS FINDINGS VASCULAR Aorta: Normal caliber aorta without aneurysm, dissection, vasculitis or significant stenosis. Celiac: Patent without evidence of aneurysm, dissection, vasculitis or significant stenosis. SMA: Patent without evidence of aneurysm, dissection, vasculitis or significant stenosis. Renals: Both renal arteries are patent without evidence of aneurysm, dissection, vasculitis, fibromuscular dysplasia or significant stenosis. IMA: Patent without evidence of aneurysm, dissection, vasculitis  or significant stenosis. Inflow: Patent without evidence of aneurysm, dissection, vasculitis or significant stenosis. Veins: No obvious venous abnormality within the limitations of this arterial phase study. Review of the MIP images confirms the above findings. NON-VASCULAR Hepatobiliary: No suspicious cystic or solid hepatic lesions. No intra or extrahepatic biliary ductal dilatation. Status post cholecystectomy. Pancreas: No pancreatic mass. No pancreatic ductal dilatation. No pancreatic or peripancreatic fluid or inflammatory changes. Spleen: Unremarkable. Adrenals/Urinary Tract: 2.5 cm simple cyst in the interpolar region of the right kidney. Left kidney and left adrenal gland are normal in appearance. Large partially calcified right adrenal mass which has internal areas of low attenuation (3 HU) measuring 4.4 x 3.2 cm, most compatible with a large adenoma. No hydroureteronephrosis. Urinary bladder is normal in appearance. Stomach/Bowel:  Normal appearance of the stomach. No pathologic dilatation of small bowel or colon. 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 The appendix is not confidently identified and may be surgically absent. Regardless, there are no inflammatory changes noted adjacent to the cecum to suggest the presence of an acute appendicitis at this time. Lymphatic: Mildly enlarged para-aortic lymph node in the left retroperitoneum measuring 12 mm in short axis (axial image 211 of series 8). No other lymphadenopathy is noted elsewhere in the abdomen or pelvis. Reproductive: Uterus and ovaries are unremarkable in appearance. Other: No significant volume of ascites.  No pneumoperitoneum. Musculoskeletal: There are no aggressive appearing lytic or blastic lesions noted in the visualized portions of the skeleton. Review of the MIP images confirms the above findings. IMPRESSION: 1. No acute findings are noted in the chest, abdomen or pelvis to account for the patient's symptoms. Specifically, no evidence of acute aortic syndrome. 2. 11 x 9 mm right upper lobe nodule which has slightly ill-defined margins. The possibility of primary bronchogenic neoplasm should be considered. Consider one of the following in 3 months for both low-risk and high-risk individuals: (a) repeat chest CT or (b) follow-up PET-CT. This recommendation follows the consensus statement: Guidelines for Management of Incidental Pulmonary Nodules Detected on CT Images: From the Fleischner Society 2017; Radiology 2017; 284:228-243. 3. Mildly enlarged left para-aortic lymph node measuring 12 mm in short axis. This is nonspecific, but attention is is recommended on any future imaging studies to ensure the stability of this finding. 4. Large right adrenal adenoma. 5. Aortic atherosclerosis, in addition to left main and 3 vessel coronary artery disease.  Assessment for potential risk factor modification, dietary therapy or pharmacologic therapy may be warranted, if clinically indicated. 6. There are calcifications of the aortic valve. Echocardiographic correlation for evaluation of potential valvular dysfunction may be warranted if clinically indicated. 7. Additional incidental findings, as above. Electronically Signed   By: Vinnie Langton M.D.   On: 11/28/2017 11:42    ROS  Blood pressure (!) 96/56, pulse 66, temperature 97.6 F (36.4 C), temperature source Oral, resp. rate 15, SpO2 95 %. Physical Exam  EKG: normal EKG, normal sinus rhythm, wotj anterior T wave inversion  Assessment/Plan 1. [place] - her symptoms have both typical and atypical features but with her multiple risk factors, elevated troponin and abnormal ECG which is different from a few months ago, I would recommend admission, starting IV heparin and proceeding with left heart cath to define her anatomy.  2. Abnormal CT - with her h/o tobacco abuse, she will require additional evaluation to rule out bronchogenic CA.  Cristopher Peru 12/01/2017, 1:09  PM

## 2017-12-09 NOTE — ED Triage Notes (Signed)
Pt presents for evaluation of chest pain with radiation to bilateral arms starting around 0100 this morning. Pt reports belching, states took something for gas with no improvement. Pt given 324 asa and 1 nitro in route.

## 2017-12-10 ENCOUNTER — Encounter (HOSPITAL_COMMUNITY): Admission: EM | Disposition: E | Payer: Self-pay | Source: Home / Self Care | Attending: Internal Medicine

## 2017-12-10 ENCOUNTER — Ambulatory Visit: Payer: Self-pay | Admitting: Internal Medicine

## 2017-12-10 DIAGNOSIS — R739 Hyperglycemia, unspecified: Secondary | ICD-10-CM | POA: Diagnosis present

## 2017-12-10 DIAGNOSIS — Z87891 Personal history of nicotine dependence: Secondary | ICD-10-CM | POA: Diagnosis not present

## 2017-12-10 DIAGNOSIS — M069 Rheumatoid arthritis, unspecified: Secondary | ICD-10-CM | POA: Diagnosis present

## 2017-12-10 DIAGNOSIS — J449 Chronic obstructive pulmonary disease, unspecified: Secondary | ICD-10-CM

## 2017-12-10 DIAGNOSIS — R7303 Prediabetes: Secondary | ICD-10-CM | POA: Diagnosis present

## 2017-12-10 DIAGNOSIS — N390 Urinary tract infection, site not specified: Secondary | ICD-10-CM | POA: Diagnosis present

## 2017-12-10 DIAGNOSIS — Z7982 Long term (current) use of aspirin: Secondary | ICD-10-CM | POA: Diagnosis not present

## 2017-12-10 DIAGNOSIS — I129 Hypertensive chronic kidney disease with stage 1 through stage 4 chronic kidney disease, or unspecified chronic kidney disease: Secondary | ICD-10-CM | POA: Diagnosis present

## 2017-12-10 DIAGNOSIS — I469 Cardiac arrest, cause unspecified: Secondary | ICD-10-CM | POA: Diagnosis not present

## 2017-12-10 DIAGNOSIS — B9620 Unspecified Escherichia coli [E. coli] as the cause of diseases classified elsewhere: Secondary | ICD-10-CM | POA: Diagnosis present

## 2017-12-10 DIAGNOSIS — Z66 Do not resuscitate: Secondary | ICD-10-CM | POA: Diagnosis present

## 2017-12-10 DIAGNOSIS — I214 Non-ST elevation (NSTEMI) myocardial infarction: Secondary | ICD-10-CM | POA: Diagnosis present

## 2017-12-10 DIAGNOSIS — I2510 Atherosclerotic heart disease of native coronary artery without angina pectoris: Secondary | ICD-10-CM

## 2017-12-10 DIAGNOSIS — G9340 Encephalopathy, unspecified: Secondary | ICD-10-CM | POA: Diagnosis not present

## 2017-12-10 DIAGNOSIS — N182 Chronic kidney disease, stage 2 (mild): Secondary | ICD-10-CM

## 2017-12-10 DIAGNOSIS — I10 Essential (primary) hypertension: Secondary | ICD-10-CM

## 2017-12-10 DIAGNOSIS — J9601 Acute respiratory failure with hypoxia: Secondary | ICD-10-CM | POA: Diagnosis not present

## 2017-12-10 DIAGNOSIS — Z79890 Hormone replacement therapy (postmenopausal): Secondary | ICD-10-CM | POA: Diagnosis not present

## 2017-12-10 DIAGNOSIS — Z79899 Other long term (current) drug therapy: Secondary | ICD-10-CM | POA: Diagnosis not present

## 2017-12-10 DIAGNOSIS — E872 Acidosis: Secondary | ICD-10-CM | POA: Diagnosis not present

## 2017-12-10 DIAGNOSIS — N179 Acute kidney failure, unspecified: Secondary | ICD-10-CM | POA: Diagnosis not present

## 2017-12-10 DIAGNOSIS — I340 Nonrheumatic mitral (valve) insufficiency: Secondary | ICD-10-CM | POA: Diagnosis not present

## 2017-12-10 DIAGNOSIS — R570 Cardiogenic shock: Secondary | ICD-10-CM | POA: Diagnosis not present

## 2017-12-10 DIAGNOSIS — I200 Unstable angina: Secondary | ICD-10-CM | POA: Diagnosis present

## 2017-12-10 DIAGNOSIS — R911 Solitary pulmonary nodule: Secondary | ICD-10-CM | POA: Diagnosis present

## 2017-12-10 DIAGNOSIS — E785 Hyperlipidemia, unspecified: Secondary | ICD-10-CM | POA: Diagnosis present

## 2017-12-10 DIAGNOSIS — E876 Hypokalemia: Secondary | ICD-10-CM | POA: Diagnosis present

## 2017-12-10 DIAGNOSIS — E559 Vitamin D deficiency, unspecified: Secondary | ICD-10-CM | POA: Diagnosis present

## 2017-12-10 DIAGNOSIS — E782 Mixed hyperlipidemia: Secondary | ICD-10-CM

## 2017-12-10 HISTORY — PX: LEFT HEART CATH AND CORONARY ANGIOGRAPHY: CATH118249

## 2017-12-10 LAB — URINALYSIS, COMPLETE (UACMP) WITH MICROSCOPIC
BILIRUBIN URINE: NEGATIVE
Glucose, UA: NEGATIVE mg/dL
KETONES UR: 5 mg/dL — AB
Nitrite: POSITIVE — AB
PH: 5 (ref 5.0–8.0)
Protein, ur: 30 mg/dL — AB
Specific Gravity, Urine: 1.027 (ref 1.005–1.030)
WBC, UA: >50 WBC/hpf — ABNORMAL HIGH (ref 0–5)

## 2017-12-10 LAB — CBC
HEMATOCRIT: 41.9 % (ref 36.0–46.0)
HEMOGLOBIN: 13.5 g/dL (ref 12.0–15.0)
MCH: 30.2 pg (ref 26.0–34.0)
MCHC: 32.2 g/dL (ref 30.0–36.0)
MCV: 93.7 fL (ref 78.0–100.0)
Platelets: 247 10*3/uL (ref 150–400)
RBC: 4.47 MIL/uL (ref 3.87–5.11)
RDW: 14.7 % (ref 11.5–15.5)
WBC: 11 10*3/uL — ABNORMAL HIGH (ref 4.0–10.5)

## 2017-12-10 LAB — LIPID PANEL
Cholesterol: 196 mg/dL (ref 0–200)
HDL: 61 mg/dL (ref >40)
LDL Cholesterol: 118 mg/dL — ABNORMAL HIGH (ref 0–99)
Total CHOL/HDL Ratio: 3.2 RATIO
Triglycerides: 87 mg/dL (ref <150)
VLDL: 17 mg/dL (ref 0–40)

## 2017-12-10 LAB — BASIC METABOLIC PANEL
ANION GAP: 10 (ref 5–15)
BUN: 16 mg/dL (ref 8–23)
CALCIUM: 9.3 mg/dL (ref 8.9–10.3)
CO2: 30 mmol/L (ref 22–32)
Chloride: 98 mmol/L (ref 98–111)
Creatinine, Ser: 0.92 mg/dL (ref 0.44–1.00)
GFR calc Af Amer: >60 mL/min (ref >60)
GFR, EST NON AFRICAN AMERICAN: 58 mL/min — AB (ref >60)
GLUCOSE: 154 mg/dL — AB (ref 70–99)
Potassium: 3.2 mmol/L — ABNORMAL LOW (ref 3.5–5.1)
Sodium: 138 mmol/L (ref 135–145)

## 2017-12-10 LAB — PROTIME-INR
INR: 1.05
Prothrombin Time: 13.6 seconds (ref 11.4–15.2)

## 2017-12-10 LAB — HEPARIN LEVEL (UNFRACTIONATED): HEPARIN UNFRACTIONATED: 0.39 [IU]/mL (ref 0.30–0.70)

## 2017-12-10 LAB — TROPONIN I: Troponin I: 8.58 ng/mL (ref <0.03)

## 2017-12-10 SURGERY — LEFT HEART CATH AND CORONARY ANGIOGRAPHY
Anesthesia: LOCAL

## 2017-12-10 MED ORDER — SODIUM CHLORIDE 0.9 % WEIGHT BASED INFUSION: 1.0000 mL/kg/h | INTRAVENOUS | Status: DC

## 2017-12-10 MED ORDER — SODIUM CHLORIDE 0.9% FLUSH: 3.0000 mL | INTRAVENOUS | Status: DC | PRN

## 2017-12-10 MED ORDER — FENTANYL CITRATE (PF) 100 MCG/2ML IJ SOLN
INTRAMUSCULAR | Status: DC | PRN
  Administered 2017-12-10: 25 ug via INTRAVENOUS

## 2017-12-10 MED ORDER — ONDANSETRON HCL 4 MG/2ML IJ SOLN: 4.0000 mg | Freq: Four times a day (QID) | INTRAMUSCULAR | Status: DC | PRN

## 2017-12-10 MED ORDER — SODIUM CHLORIDE 0.9% FLUSH: 3.0000 mL | Freq: Two times a day (BID) | INTRAVENOUS | Status: DC

## 2017-12-10 MED ORDER — IOHEXOL 350 MG/ML SOLN
INTRAVENOUS | Status: DC | PRN
  Administered 2017-12-10: 80 mL

## 2017-12-10 MED ORDER — VERAPAMIL HCL 2.5 MG/ML IV SOLN
INTRAVENOUS | Status: AC
  Filled 2017-12-10: qty 2

## 2017-12-10 MED ORDER — HEPARIN SODIUM (PORCINE) 5000 UNIT/ML IJ SOLN
5000.0000 [IU] | Freq: Three times a day (TID) | INTRAMUSCULAR | Status: DC
  Administered 2017-12-10 – 2017-12-11 (×2): 5000 [IU] via SUBCUTANEOUS
  Filled 2017-12-10 (×2): qty 1

## 2017-12-10 MED ORDER — MIDAZOLAM HCL 2 MG/2ML IJ SOLN
INTRAMUSCULAR | Status: DC | PRN
  Administered 2017-12-10: 0.5 mg via INTRAVENOUS

## 2017-12-10 MED ORDER — SODIUM CHLORIDE 0.9% FLUSH
3.0000 mL | Freq: Two times a day (BID) | INTRAVENOUS | Status: DC
  Administered 2017-12-10 – 2017-12-11 (×2): 3 mL via INTRAVENOUS

## 2017-12-10 MED ORDER — LIDOCAINE HCL (PF) 1 % IJ SOLN
INTRAMUSCULAR | Status: DC | PRN
  Administered 2017-12-10: 2 mL

## 2017-12-10 MED ORDER — SODIUM CHLORIDE 0.9 % IV SOLN: 250.0000 mL | INTRAVENOUS | Status: DC | PRN

## 2017-12-10 MED ORDER — SODIUM CHLORIDE 0.9 % WEIGHT BASED INFUSION: 3.0000 mL/kg/h | INTRAVENOUS | Status: AC

## 2017-12-10 MED ORDER — HEPARIN SODIUM (PORCINE) 1000 UNIT/ML IJ SOLN
INTRAMUSCULAR | Status: AC
  Filled 2017-12-10: qty 1

## 2017-12-10 MED ORDER — HEPARIN SODIUM (PORCINE) 1000 UNIT/ML IJ SOLN
INTRAMUSCULAR | Status: DC | PRN
  Administered 2017-12-10: 4000 [IU] via INTRAVENOUS

## 2017-12-10 MED ORDER — ACETAMINOPHEN 325 MG PO TABS
650.0000 mg | ORAL_TABLET | ORAL | Status: DC | PRN
  Filled 2017-12-10: qty 2

## 2017-12-10 MED ORDER — ASPIRIN 81 MG PO CHEW
81.0000 mg | CHEWABLE_TABLET | Freq: Every day | ORAL | Status: DC
  Administered 2017-12-11: 81 mg via ORAL
  Filled 2017-12-10: qty 1

## 2017-12-10 MED ORDER — LIDOCAINE HCL (PF) 1 % IJ SOLN
INTRAMUSCULAR | Status: AC
  Filled 2017-12-10: qty 30

## 2017-12-10 MED ORDER — ASPIRIN 81 MG PO CHEW
CHEWABLE_TABLET | ORAL | Status: AC
  Administered 2017-12-10: 81 mg
  Filled 2017-12-10: qty 1

## 2017-12-10 MED ORDER — SODIUM CHLORIDE 0.9 % IV SOLN
INTRAVENOUS | Status: AC
  Administered 2017-12-10: 16:00:00 via INTRAVENOUS

## 2017-12-10 MED ORDER — POTASSIUM CHLORIDE CRYS ER 20 MEQ PO TBCR
40.0000 meq | EXTENDED_RELEASE_TABLET | ORAL | Status: AC
  Administered 2017-12-10: 40 meq via ORAL
  Filled 2017-12-10: qty 2

## 2017-12-10 MED ORDER — HEPARIN (PORCINE) IN NACL 1000-0.9 UT/500ML-% IV SOLN
INTRAVENOUS | Status: DC | PRN
  Administered 2017-12-10 (×2): 500 mL

## 2017-12-10 MED ORDER — SODIUM CHLORIDE 0.9 % IV SOLN
250.0000 mL | INTRAVENOUS | Status: DC | PRN
  Administered 2017-12-11: 250 mL via INTRAVENOUS

## 2017-12-10 MED ORDER — CARVEDILOL 3.125 MG PO TABS
3.1250 mg | ORAL_TABLET | Freq: Two times a day (BID) | ORAL | Status: DC
  Administered 2017-12-10 (×2): 3.125 mg via ORAL
  Filled 2017-12-10 (×3): qty 1

## 2017-12-10 MED ORDER — ATORVASTATIN CALCIUM 80 MG PO TABS
80.0000 mg | ORAL_TABLET | Freq: Every day | ORAL | Status: DC
  Administered 2017-12-10: 80 mg via ORAL
  Filled 2017-12-10: qty 1

## 2017-12-10 MED ORDER — OXYCODONE HCL 5 MG PO TABS: 5.0000 mg | ORAL_TABLET | ORAL | Status: DC | PRN

## 2017-12-10 MED ORDER — CLOPIDOGREL BISULFATE 75 MG PO TABS
75.0000 mg | ORAL_TABLET | Freq: Every day | ORAL | Status: DC
  Administered 2017-12-11: 75 mg via ORAL
  Filled 2017-12-10: qty 1

## 2017-12-10 MED ORDER — MIDAZOLAM HCL 2 MG/2ML IJ SOLN
INTRAMUSCULAR | Status: AC
  Filled 2017-12-10: qty 2

## 2017-12-10 MED ORDER — VERAPAMIL HCL 2.5 MG/ML IV SOLN
INTRAVENOUS | Status: DC | PRN
  Administered 2017-12-10: 10 mL via INTRA_ARTERIAL

## 2017-12-10 MED ORDER — FENTANYL CITRATE (PF) 100 MCG/2ML IJ SOLN
INTRAMUSCULAR | Status: AC
  Filled 2017-12-10: qty 2

## 2017-12-10 MED ORDER — HEPARIN (PORCINE) IN NACL 1000-0.9 UT/500ML-% IV SOLN
INTRAVENOUS | Status: AC
  Filled 2017-12-10: qty 1000

## 2017-12-10 SURGICAL SUPPLY — 11 items
CATH 5FR JL3.5 JR4 ANG PIG MP (CATHETERS) ×1 IMPLANT
COVER PRB 48X5XTLSCP FOLD TPE (BAG) IMPLANT
COVER PROBE 5X48 (BAG) ×2
DEVICE RAD COMP TR BAND LRG (VASCULAR PRODUCTS) ×1 IMPLANT
GLIDESHEATH SLEND A-KIT 6F 22G (SHEATH) ×1 IMPLANT
GUIDEWIRE INQWIRE 1.5J.035X260 (WIRE) IMPLANT
INQWIRE 1.5J .035X260CM (WIRE) ×2
KIT HEART LEFT (KITS) ×2 IMPLANT
PACK CARDIAC CATHETERIZATION (CUSTOM PROCEDURE TRAY) ×2 IMPLANT
TRANSDUCER W/STOPCOCK (MISCELLANEOUS) ×2 IMPLANT
TUBING CIL FLEX 10 FLL-RA (TUBING) ×2 IMPLANT

## 2017-12-10 NOTE — H&P (View-Only) (Signed)
interpolar region of the right kidney. Left kidney and left adrenal gland are normal in appearance. Large partially calcified right adrenal mass which has internal areas of low attenuation (3 HU) measuring 4.4 x 3.2 cm, most compatible with a large adenoma. No hydroureteronephrosis. Urinary bladder is normal in appearance. Stomach/Bowel: Normal appearance of the stomach. No pathologic dilatation of small bowel or colon. 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 The appendix is not confidently identified and may be surgically absent. Regardless, there are no inflammatory changes noted adjacent to the cecum to suggest the presence of an acute appendicitis at this time. Lymphatic: Mildly enlarged para-aortic lymph node in the left retroperitoneum measuring 12 mm in short axis (axial image 211 of series 8). No other lymphadenopathy is noted elsewhere in the abdomen or pelvis. Reproductive: Uterus and ovaries are unremarkable in appearance. Other: No significant volume of ascites.  No pneumoperitoneum.  Musculoskeletal: There are no aggressive appearing lytic or blastic lesions noted in the visualized portions of the skeleton. Review of the MIP images confirms the above findings. IMPRESSION: 1. No acute findings are noted in the chest, abdomen or pelvis to account for the patient's symptoms. Specifically, no evidence of acute aortic syndrome. 2. 11 x 9 mm right upper lobe nodule which has slightly ill-defined margins. The possibility of primary bronchogenic neoplasm should be considered. Consider one of the following in 3 months for both low-risk and high-risk individuals: (a) repeat chest CT or (b) follow-up PET-CT. This recommendation follows the consensus statement: Guidelines for Management of Incidental Pulmonary Nodules Detected on CT Images: From the Fleischner Society 2017; Radiology 2017; 284:228-243. 3. Mildly enlarged left para-aortic lymph node measuring 12 mm in short axis. This is nonspecific, but attention is is recommended on any future imaging studies to ensure the stability of this finding. 4. Large right adrenal adenoma. 5. Aortic atherosclerosis, in addition to left main and 3 vessel coronary artery disease. Assessment for potential risk factor modification, dietary therapy or pharmacologic therapy may be warranted, if clinically indicated. 6. There are calcifications of the aortic valve. Echocardiographic correlation for evaluation of potential valvular dysfunction may be warranted if clinically indicated. 7. Additional incidental findings, as above. Electronically Signed   By: Vinnie Langton M.D.   On: 12/17/2017 11:42    Cardiac Studies   Echo pending  Assessment   Principal Problem:   Non-ST elevation (NSTEMI) myocardial infarction [facility]) Active Problems:   Hyperlipemia   HTN (hypertension)   Rheumatoid arthritis (HCC)   CKD (chronic kidney disease) stage 2, GFR 60-89 ml/min   COPD (chronic obstructive pulmonary disease) (HCC)   Pulmonary nodule   Plan   1. Plan for  LHC today - supplement potassium. Has been NPO. On ASA, HIS - start coreg 3.125 mg BID. Recommend resume losartan - possibly at reduced dose prior to d/c.  Time Spent Directly with Patient:  I have spent a total of 15 minutes with the patient reviewing hospital notes, telemetry, EKGs, labs and examining the patient as well as establishing an assessment and plan that was discussed personally with the patient.  > 50% of time was spent in direct patient care.  Length of Stay:  LOS: 0 days   Pixie Casino, MD, [facility], [place] Director of the Advanced Lipid Disorders &  Cardiovascular Risk Reduction Clinic Diplomate of the American Board of Clinical Lipidology  Attending Cardiologist  Direct Dial: [phone]  Fax: [phone]  Website:  [email or web address]  Nadean Corwin Aamori Mcmasters 12/25/2017, 8:21 AM  Attending Cardiologist  Direct Dial: [phone]  Fax: [phone]  Website:  [email or web address]  Nadean Corwin Aamori Mcmasters 12/25/2017, 8:21 AM  Attending Cardiologist  Direct Dial: [phone]  Fax: [phone]  Website:  [email or web address]  Nadean Corwin Aamori Mcmasters 12/25/2017, 8:21 AM  Attending Cardiologist  Direct Dial: [phone]  Fax: [phone]  Website:  [email or web address]  Nadean Corwin Aamori Mcmasters 12/25/2017, 8:21 AM  Attending Cardiologist  Direct Dial: [phone]  Fax: [phone]  Website:  [email or web address]  Nadean Corwin Aamori Mcmasters 12/25/2017, 8:21 AM

## 2017-12-10 NOTE — Progress Notes (Signed)
DAILY PROGRESS NOTE   Patient Name: Angelica Parker Date of Encounter: 11/26/2017  Chief Complaint   No further chest pain  Patient Profile   77 yo female with HTN, HLD, RA and prediabetes, presents with midsternal chest pressure and bilateral arm pain, concerning for unstable angina. Ruled-in NSTEMI.  Subjective   No chest pain overnight. Troponin peaked at 8.58. Hypokalemic today at 3.2. LDL elevated 118. Large right upper lobe nodule noted on chest CT which will require follow-up.  Objective   Vitals:   12/16/2017 0455 11/26/2017 0600 12/01/2017 0628 12/24/2017 0726  BP:  (!) 147/81  (!) 154/82  Pulse:  89  79  Resp:      Temp:    98.2 F (36.8 C)  TempSrc:    Oral  SpO2:  97%  95%  Weight: 189 lb 3.2 oz (85.8 kg)  189 lb 3.2 oz (85.8 kg)   Height:        Intake/Output Summary (Last 24 hours) at 12/14/2017 4174 Last data filed at 12/18/2017 0100 Gross per 24 hour  Intake 312.3 ml  Output 750 ml  Net -437.7 ml   Filed Weights   12/02/2017 1400 12/14/2017 0455 12/26/2017 0628  Weight: 200 lb (90.7 kg) 189 lb 3.2 oz (85.8 kg) 189 lb 3.2 oz (85.8 kg)    Physical Exam   General appearance: alert and no distress Lungs: clear to auscultation bilaterally Heart: regular rate and rhythm Extremities: extremities normal, atraumatic, no cyanosis or edema Neurologic: Grossly normal  Inpatient Medications    Scheduled Meds: . aspirin EC  81 mg Oral Daily  . atorvastatin  40 mg Oral q1800  . citalopram  40 mg Oral Daily  . levothyroxine  112 mcg Oral QAC breakfast  . montelukast  10 mg Oral QHS  . pneumococcal 23 valent vaccine  0.5 mL Intramuscular Tomorrow-1000  . sodium chloride flush  3 mL Intravenous Q12H    Continuous Infusions: . sodium chloride    . sodium chloride 1 mL/kg/hr (12/24/2017 0814)  . heparin 950 Units/hr (12/12/2017 1530)  . nitroGLYCERIN 10 mcg/min (12/04/2017 0616)    PRN Meds: sodium chloride, acetaminophen, morphine injection, nitroGLYCERIN, ondansetron  (ZOFRAN) IV, sodium chloride flush   Labs   Results for orders placed or performed during the hospital encounter of 12/20/2017 (from the past 48 hour(s))  Basic metabolic panel     Status: Abnormal   Collection Time: 12/16/2017 10:14 AM  Result Value Ref Range   Sodium 136 135 - 145 mmol/L   Potassium 3.5 3.5 - 5.1 mmol/L   Chloride 98 98 - 111 mmol/L    Comment: Please note change in reference range.   CO2 25 22 - 32 mmol/L   Glucose, Bld 179 (H) 70 - 99 mg/dL    Comment: Please note change in reference range.   BUN 15 8 - 23 mg/dL    Comment: Please note change in reference range.   Creatinine, Ser 0.93 0.44 - 1.00 mg/dL   Calcium 9.3 8.9 - 10.3 mg/dL   GFR calc non Af Amer 57 (L) >60 mL/min   GFR calc Af Amer >60 >60 mL/min    Comment: (NOTE) The eGFR has been calculated using the CKD EPI equation. This calculation has not been validated in all clinical situations. eGFR's persistently <60 mL/min signify possible Chronic Kidney Disease.    Anion gap 13 5 - 15    Comment: Performed at [facility] [address] [place], [place]  [address]  CBC     Status: None   Collection Time: 12/16/2017 10:14 AM  Result Value Ref Range   WBC 7.2 4.0 - 10.5 K/uL   RBC 4.57 3.87 - 5.11 MIL/uL   Hemoglobin 14.0 12.0 - 15.0 g/dL   HCT 42.6 36.0 - 46.0 %   MCV 93.2 78.0 - 100.0 fL   MCH 30.6 26.0 - 34.0 pg   MCHC 32.9 30.0 - 36.0 g/dL   RDW 14.4 11.5 - 15.5 %   Platelets 246 150 - 400 K/uL    Comment: Performed at [facility], [place] [address], [place], [place] [address]  I-stat troponin, ED     Status: Abnormal   Collection Time: 11/29/2017 10:22 AM  Result Value Ref Range   Troponin i, poc 0.24 (HH) 0.00 - 0.08 ng/mL   Comment NOTIFIED PHYSICIAN    Comment 3            Comment: Due to the release kinetics of cTnI, a negative result within the first hours of the onset of symptoms does not rule out myocardial infarction with certainty. If myocardial infarction is still  suspected, repeat the test at appropriate intervals.   Troponin I     Status: Abnormal   Collection Time: 12/15/2017  2:09 PM  Result Value Ref Range   Troponin I 0.37 (HH) <0.03 ng/mL    Comment: CRITICAL RESULT CALLED TO, READ BACK BY AND VERIFIED WITH: K  COBB  RN @ 1308 ON 12/26/2017 BY HTEMOCHE Performed at [facility], [place] [address], [place], [place] [address]   MRSA PCR Screening     Status: None   Collection Time: 12/08/2017  4:16 PM  Result Value Ref Range   MRSA by PCR NEGATIVE NEGATIVE    Comment:        The GeneXpert MRSA Assay (FDA approved for NASAL specimens only), is one component of a comprehensive MRSA colonization surveillance program. It is not intended to diagnose MRSA infection nor to guide or monitor treatment for MRSA infections. Performed at [facility], [place] [address], [place], [place] [address]   Troponin I     Status: Abnormal   Collection Time: 12/14/2017  9:28 PM  Result Value Ref Range   Troponin I 3.96 (HH) <0.03 ng/mL    Comment: CRITICAL RESULT CALLED TO, READ BACK BY AND VERIFIED WITH: Faythe Ghee 12/24/2017 2235 WAYK Performed at [facility] [address], [place], [place] [address]   Heparin level (unfractionated)     Status: None   Collection Time: 12/20/2017  9:28 PM  Result Value Ref Range   Heparin Unfractionated 0.53 0.30 - 0.70 IU/mL    Comment: (NOTE) If heparin results are below expected values, and patient dosage has  been confirmed, suggest follow up testing of antithrombin III levels. Performed at [facility], [place] [address], [place], [place] [address]   Troponin I     Status: Abnormal   Collection Time: 12/17/2017  3:05 AM  Result Value Ref Range   Troponin I 8.58 (HH) <0.03 ng/mL    Comment: CRITICAL VALUE NOTED.  VALUE IS CONSISTENT WITH PREVIOUSLY REPORTED AND CALLED VALUE. Performed at [facility], [place] [address] [place], [place] [address]   Basic metabolic panel     Status: Abnormal    Collection Time: 12/05/2017  3:05 AM  Result Value Ref Range   Sodium 138 135 - 145 mmol/L   Potassium 3.2 (  L) 3.5 - 5.1 mmol/L   Chloride 98 98 - 111 mmol/L    Comment: Please note change in reference range.   CO2 30 22 - 32 mmol/L   Glucose, Bld 154 (H) 70 - 99 mg/dL    Comment: Please note change in reference range.   BUN 16 8 - 23 mg/dL    Comment: Please note change in reference range.   Creatinine, Ser 0.92 0.44 - 1.00 mg/dL   Calcium 9.3 8.9 - 10.3 mg/dL   GFR calc non Af Amer 58 (L) >60 mL/min   GFR calc Af Amer >60 >60 mL/min    Comment: (NOTE) The eGFR has been calculated using the CKD EPI equation. This calculation has not been validated in all clinical situations. eGFR's persistently <60 mL/min signify possible Chronic Kidney Disease.    Anion gap 10 5 - 15    Comment: Performed at [facility] [address], [place], [place] [address]  Lipid panel     Status: Abnormal   Collection Time: 12/24/2017  3:05 AM  Result Value Ref Range   Cholesterol 196 0 - 200 mg/dL   Triglycerides 87 <150 mg/dL   HDL 61 >40 mg/dL   Total CHOL/HDL Ratio 3.2 RATIO   VLDL 17 0 - 40 mg/dL   LDL Cholesterol 118 (H) 0 - 99 mg/dL    Comment:        Total Cholesterol/HDL:CHD Risk Coronary Heart Disease Risk Table                     Men   Women  1/2 Average Risk   3.4   3.3  Average Risk       5.0   4.4  2 X Average Risk   9.6   7.1  3 X Average Risk  23.4   11.0        Use the calculated Patient Ratio above and the CHD Risk Table to determine the patient's CHD Risk.        ATP III CLASSIFICATION (LDL):  <100     mg/dL   Optimal  100-129  mg/dL   Near or Above                    Optimal  130-159  mg/dL   Borderline  160-189  mg/dL   High  >190     mg/dL   Very High Performed at [facility] [address] [place], [place] [address]   Heparin level (unfractionated)     Status: None   Collection Time: 12/21/2017  3:05 AM  Result Value Ref Range   Heparin  Unfractionated 0.39 0.30 - 0.70 IU/mL    Comment: (NOTE) If heparin results are below expected values, and patient dosage has  been confirmed, suggest follow up testing of antithrombin III levels. Performed at [facility], [place] [address] [place], [place] [address]   CBC     Status: Abnormal   Collection Time: 12/03/2017  3:05 AM  Result Value Ref Range   WBC 11.0 (H) 4.0 - 10.5 K/uL   RBC 4.47 3.87 - 5.11 MIL/uL   Hemoglobin 13.5 12.0 - 15.0 g/dL   HCT 41.9 36.0 - 46.0 %   MCV 93.7 78.0 - 100.0 fL   MCH 30.2 26.0 - 34.0 pg   MCHC 32.2 30.0 - 36.0 g/dL   RDW 14.7 11.5 - 15.5 %   Platelets 247 150 - 400  K/uL    Comment: Performed at [facility], [place] [address], [place], [place] [address]  Protime-INR     Status: None   Collection Time: 12/21/2017  3:05 AM  Result Value Ref Range   Prothrombin Time 13.6 11.4 - 15.2 seconds   INR 1.05     Comment: Performed at [facility] [address], [place], [place] [address]    ECG   N/A  Telemetry   Sinus rhythm - Personally Reviewed  Radiology    Dg Chest 2 View  Result Date: 12/23/2017 CLINICAL DATA:  77 year old female with history of chest pain with radiation into the arms bilaterally. EXAM: CHEST - 2 VIEW COMPARISON:  Chest x-ray 06/02/2014. FINDINGS: Lung volumes are normal. No consolidative airspace disease. No pleural effusions. No pneumothorax. No pulmonary nodule or mass noted. Pulmonary vasculature and the cardiomediastinal silhouette are within normal limits. Aortic atherosclerosis IMPRESSION: 1.  No radiographic evidence of acute cardiopulmonary disease. 2. Aortic atherosclerosis. Electronically Signed   By: Vinnie Langton M.D.   On: 11/30/2017 10:40   Ct Angio Chest/abd/pel For Dissection W And/or W/wo  Result Date: 11/30/2017 CLINICAL DATA:  77 year old female with history of chest pain and radiating into the arms bilaterally since 1 a.m. this morning. EXAM: CT ANGIOGRAPHY CHEST, ABDOMEN AND PELVIS  TECHNIQUE: Multidetector CT imaging through the chest, abdomen and pelvis was performed using the standard protocol during bolus administration of intravenous contrast. Multiplanar reconstructed images and MIPs were obtained and reviewed to evaluate the vascular anatomy. CONTRAST:  158m ISOVUE-370 IOPAMIDOL (ISOVUE-370) INJECTION 76% COMPARISON:  No priors. FINDINGS: CTA CHEST FINDINGS Cardiovascular: No crescentic high attenuation noted associated with the thoracic aorta to suggest acute intramural hemorrhage. No evidence of thoracic aortic aneurysm or dissection on CTA images. Heart size is normal. There is no significant pericardial fluid, thickening or pericardial calcification. There is aortic atherosclerosis, as well as atherosclerosis of the great vessels of the mediastinum and the coronary arteries, including calcified atherosclerotic plaque in the left main, left anterior descending, left circumflex and right coronary arteries. Mild calcifications of the aortic valve. Mediastinum/Nodes: No pathologically enlarged mediastinal or hilar lymph nodes. Small hiatal hernia. No axillary lymphadenopathy. Lungs/Pleura: 11 x 9 mm right upper lobe nodule (axial image 60 of series 9) with slightly ill-defined margins. No acute consolidative airspace disease. No pleural effusions. Linear areas of scarring and/or atelectasis in the anterior aspect of the left lower lobe and medial segment of the right middle lobe. Musculoskeletal: There are no aggressive appearing lytic or blastic lesions noted in the visualized portions of the skeleton. Review of the MIP images confirms the above findings. CTA ABDOMEN AND PELVIS FINDINGS VASCULAR Aorta: Normal caliber aorta without aneurysm, dissection, vasculitis or significant stenosis. Celiac: Patent without evidence of aneurysm, dissection, vasculitis or significant stenosis. SMA: Patent without evidence of aneurysm, dissection, vasculitis or significant stenosis. Renals: Both renal  arteries are patent without evidence of aneurysm, dissection, vasculitis, fibromuscular dysplasia or significant stenosis. IMA: Patent without evidence of aneurysm, dissection, vasculitis or significant stenosis. Inflow: Patent without evidence of aneurysm, dissection, vasculitis or significant stenosis. Veins: No obvious venous abnormality within the limitations of this arterial phase study. Review of the MIP images confirms the above findings. NON-VASCULAR Hepatobiliary: No suspicious cystic or solid hepatic lesions. No intra or extrahepatic biliary ductal dilatation. Status post cholecystectomy. Pancreas: No pancreatic mass. No pancreatic ductal dilatation. No pancreatic or peripancreatic fluid or inflammatory changes. Spleen: Unremarkable. Adrenals/Urinary Tract: 2.5 cm simple cyst in the  interpolar region of the right kidney. Left kidney and left adrenal gland are normal in appearance. Large partially calcified right adrenal mass which has internal areas of low attenuation (3 HU) measuring 4.4 x 3.2 cm, most compatible with a large adenoma. No hydroureteronephrosis. Urinary bladder is normal in appearance. Stomach/Bowel: Normal appearance of the stomach. No pathologic dilatation of small bowel or colon. 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 The appendix is not confidently identified and may be surgically absent. Regardless, there are no inflammatory changes noted adjacent to the cecum to suggest the presence of an acute appendicitis at this time. Lymphatic: Mildly enlarged para-aortic lymph node in the left retroperitoneum measuring 12 mm in short axis (axial image 211 of series 8). No other lymphadenopathy is noted elsewhere in the abdomen or pelvis. Reproductive: Uterus and ovaries are unremarkable in appearance. Other: No significant volume of ascites.  No pneumoperitoneum.  Musculoskeletal: There are no aggressive appearing lytic or blastic lesions noted in the visualized portions of the skeleton. Review of the MIP images confirms the above findings. IMPRESSION: 1. No acute findings are noted in the chest, abdomen or pelvis to account for the patient's symptoms. Specifically, no evidence of acute aortic syndrome. 2. 11 x 9 mm right upper lobe nodule which has slightly ill-defined margins. The possibility of primary bronchogenic neoplasm should be considered. Consider one of the following in 3 months for both low-risk and high-risk individuals: (a) repeat chest CT or (b) follow-up PET-CT. This recommendation follows the consensus statement: Guidelines for Management of Incidental Pulmonary Nodules Detected on CT Images: From the Fleischner Society 2017; Radiology 2017; 284:228-243. 3. Mildly enlarged left para-aortic lymph node measuring 12 mm in short axis. This is nonspecific, but attention is is recommended on any future imaging studies to ensure the stability of this finding. 4. Large right adrenal adenoma. 5. Aortic atherosclerosis, in addition to left main and 3 vessel coronary artery disease. Assessment for potential risk factor modification, dietary therapy or pharmacologic therapy may be warranted, if clinically indicated. 6. There are calcifications of the aortic valve. Echocardiographic correlation for evaluation of potential valvular dysfunction may be warranted if clinically indicated. 7. Additional incidental findings, as above. Electronically Signed   By: Vinnie Langton M.D.   On: 12/14/2017 11:42    Cardiac Studies   Echo pending  Assessment   Principal Problem:   Non-ST elevation (NSTEMI) myocardial infarction [facility]) Active Problems:   Hyperlipemia   HTN (hypertension)   Rheumatoid arthritis (HCC)   CKD (chronic kidney disease) stage 2, GFR 60-89 ml/min   COPD (chronic obstructive pulmonary disease) (HCC)   Pulmonary nodule   Plan   1. Plan for  LHC today - supplement potassium. Has been NPO. On ASA, HIS - start coreg 3.125 mg BID. Recommend resume losartan - possibly at reduced dose prior to d/c.  Time Spent Directly with Patient:  I have spent a total of 15 minutes with the patient reviewing hospital notes, telemetry, EKGs, labs and examining the patient as well as establishing an assessment and plan that was discussed personally with the patient.  > 50% of time was spent in direct patient care.  Length of Stay:  LOS: 0 days   Pixie Casino, MD, [facility], [place] Director of the Advanced Lipid Disorders &  Cardiovascular Risk Reduction Clinic Diplomate of the American Board of Clinical Lipidology  Attending Cardiologist  Direct Dial: [phone]  Fax: [phone]  Website:  [email or web address]  Nadean Corwin Hilty 12/26/2017, 8:21 AM

## 2017-12-10 NOTE — Progress Notes (Signed)
ANTICOAGULATION CONSULT NOTE  Pharmacy Consult:  Heparin Indication: chest pain/ACS  Allergies  Allergen Reactions  . Influenza Vaccines Swelling    Patient states her arms swells up sometimes but she tolerates flu vaccine if given with benadryl  . Ace Inhibitors Cough    Other reaction(s): Other (See Comments) Made her cough    Patient Measurements: Height: 5\' 6"  (167.6 cm) Weight: 189 lb 3.2 oz (85.8 kg) IBW/kg (Calculated) : 59.3 Heparin Dosing Weight: 79 kg  Vital Signs: Temp: 98.2 F (36.8 C) (07/15 0726) Temp Source: Oral (07/15 0726) BP: 154/82 (07/15 0726) Pulse Rate: 79 (07/15 0726)  Labs: Recent Labs    12/06/2017 1014 12/19/2017 1409 12/21/2017 2128 11/27/2017 0305  HGB 14.0  --   --  13.5  HCT 42.6  --   --  41.9  PLT 246  --   --  247  LABPROT  --   --   --  13.6  INR  --   --   --  1.05  HEPARINUNFRC  --   --  0.53 0.39  CREATININE 0.93  --   --  0.92  TROPONINI  --  0.37* 3.96* 8.58*    Estimated Creatinine Clearance: 55.6 mL/min (by C-G formula based on SCr of 0.92 mg/dL).   Medical History: Past Medical History:  Diagnosis Date  . Allergy   . Cataract 05/04/2017   Bilateral - removed 2018   . Hyperlipidemia   . Hypertension   . Prediabetes   . Rheumatoid arthritis (HCC)   . Thyroid disease   . Vitamin D deficiency     Assessment: 6 YOF presented with chest pain.  CT negative for dissection.  Pharmacy consulted to initiate IV heparin for ACS.   Heparin level remains at goal this AM. No s/sx of bleeding. No infusion issues.   Troponins rising, planning LHC.  Goal of Therapy:  Heparin level 0.3-0.7 units/ml Monitor platelets by anticoagulation protocol: Yes   Plan:  Continue heparin infusion at 950 units/hr Daily heparin level and CBC F/u plans for heparin after cath lab.  70, [facility] Clinical Pharmacist Phone [phone]  12/07/2017 7:56 AM

## 2017-12-10 NOTE — Interval H&P Note (Signed)
Cath Lab Visit (complete for each Cath Lab visit)  Clinical Evaluation Leading to the Procedure:   ACS: Yes.    Non-ACS:    Anginal Classification: CCS III  Anti-ischemic medical therapy: Minimal Therapy (1 class of medications)  Non-Invasive Test Results: No non-invasive testing performed  Prior CABG: No previous CABG      History and Physical Interval Note:  12/24/2017 3:09 PM  Dallie Dad  has presented today for surgery, with the diagnosis of unstable angina  The various methods of treatment have been discussed with the patient and family. After consideration of risks, benefits and other options for treatment, the patient has consented to  Procedure(s): LEFT HEART CATH AND CORONARY ANGIOGRAPHY (N/A) as a surgical intervention .  The patient's history has been reviewed, patient examined, no change in status, stable for surgery.  I have reviewed the patient's chart and labs.  Questions were answered to the patient's satisfaction.     Lyn Records III

## 2017-12-11 ENCOUNTER — Other Ambulatory Visit: Payer: Self-pay

## 2017-12-11 ENCOUNTER — Inpatient Hospital Stay (HOSPITAL_COMMUNITY): Payer: Medicare Other | Admitting: Anesthesiology

## 2017-12-11 ENCOUNTER — Inpatient Hospital Stay (HOSPITAL_COMMUNITY): Payer: Medicare Other

## 2017-12-11 ENCOUNTER — Encounter (HOSPITAL_COMMUNITY): Payer: Self-pay | Admitting: Interventional Cardiology

## 2017-12-11 DIAGNOSIS — I469 Cardiac arrest, cause unspecified: Secondary | ICD-10-CM

## 2017-12-11 DIAGNOSIS — R579 Shock, unspecified: Secondary | ICD-10-CM

## 2017-12-11 DIAGNOSIS — Z978 Presence of other specified devices: Secondary | ICD-10-CM

## 2017-12-11 DIAGNOSIS — J9600 Acute respiratory failure, unspecified whether with hypoxia or hypercapnia: Secondary | ICD-10-CM

## 2017-12-11 DIAGNOSIS — I340 Nonrheumatic mitral (valve) insufficiency: Secondary | ICD-10-CM

## 2017-12-11 LAB — BASIC METABOLIC PANEL
ANION GAP: 20 — AB (ref 5–15)
BUN: 12 mg/dL (ref 8–23)
CHLORIDE: 102 mmol/L (ref 98–111)
CO2: 16 mmol/L — ABNORMAL LOW (ref 22–32)
Calcium: 7.5 mg/dL — ABNORMAL LOW (ref 8.9–10.3)
Creatinine, Ser: 1.43 mg/dL — ABNORMAL HIGH (ref 0.44–1.00)
GFR calc Af Amer: 40 mL/min — ABNORMAL LOW (ref >60)
GFR, EST NON AFRICAN AMERICAN: 34 mL/min — AB (ref >60)
GLUCOSE: 346 mg/dL — AB (ref 70–99)
POTASSIUM: 3.2 mmol/L — AB (ref 3.5–5.1)
SODIUM: 138 mmol/L (ref 135–145)

## 2017-12-11 LAB — POCT I-STAT 3, ART BLOOD GAS (G3+)
Acid-base deficit: 9 mmol/L — ABNORMAL HIGH (ref 0.0–2.0)
Acid-base deficit: 8 mmol/L — ABNORMAL HIGH (ref 0.0–2.0)
Bicarbonate: 18 mmol/L — ABNORMAL LOW (ref 20.0–28.0)
Bicarbonate: 17.8 mmol/L — ABNORMAL LOW (ref 20.0–28.0)
O2 SAT: 100 %
O2 Saturation: 94 %
PCO2 ART: 36 mmHg (ref 32.0–48.0)
PH ART: 7.263 — AB (ref 7.350–7.450)
PH ART: 7.3 — AB (ref 7.350–7.450)
PO2 ART: 301 mmHg — AB (ref 83.0–108.0)
Patient temperature: 97.7
TCO2: 19 mmol/L — AB (ref 22–32)
TCO2: 19 mmol/L — ABNORMAL LOW (ref 22–32)
pCO2 arterial: 39.5 mmHg (ref 32.0–48.0)
pO2, Arterial: 77 mmHg — ABNORMAL LOW (ref 83.0–108.0)

## 2017-12-11 LAB — CBC
HEMATOCRIT: 35.1 % — AB (ref 36.0–46.0)
Hemoglobin: 10.9 g/dL — ABNORMAL LOW (ref 12.0–15.0)
MCH: 30.5 pg (ref 26.0–34.0)
MCHC: 31.1 g/dL (ref 30.0–36.0)
MCV: 98.3 fL (ref 78.0–100.0)
Platelets: 184 10*3/uL (ref 150–400)
RBC: 3.57 MIL/uL — ABNORMAL LOW (ref 3.87–5.11)
RDW: 14.8 % (ref 11.5–15.5)
WBC: 20.3 10*3/uL — AB (ref 4.0–10.5)

## 2017-12-11 LAB — ECHOCARDIOGRAM COMPLETE
HEIGHTINCHES: 63 in
Weight: 3027.2 oz

## 2017-12-11 LAB — POCT I-STAT, CHEM 8
BUN: 11 mg/dL (ref 8–23)
CALCIUM ION: 1.02 mmol/L — AB (ref 1.15–1.40)
Chloride: 101 mmol/L (ref 98–111)
Creatinine, Ser: 1 mg/dL (ref 0.44–1.00)
Glucose, Bld: 327 mg/dL — ABNORMAL HIGH (ref 70–99)
HCT: 32 % — ABNORMAL LOW (ref 36.0–46.0)
Hemoglobin: 10.9 g/dL — ABNORMAL LOW (ref 12.0–15.0)
Potassium: 3.2 mmol/L — ABNORMAL LOW (ref 3.5–5.1)
SODIUM: 138 mmol/L (ref 135–145)
TCO2: 19 mmol/L — ABNORMAL LOW (ref 22–32)

## 2017-12-11 LAB — LACTIC ACID, PLASMA: LACTIC ACID, VENOUS: 10.4 mmol/L — AB (ref 0.5–1.9)

## 2017-12-11 LAB — TSH: TSH: 0.761 u[IU]/mL (ref 0.350–4.500)

## 2017-12-11 LAB — GLUCOSE, CAPILLARY
GLUCOSE-CAPILLARY: 179 mg/dL — AB (ref 70–99)
Glucose-Capillary: 370 mg/dL — ABNORMAL HIGH (ref 70–99)

## 2017-12-11 LAB — CORTISOL: CORTISOL PLASMA: 27.6 ug/dL

## 2017-12-11 MED ORDER — DOPAMINE-DEXTROSE 3.2-5 MG/ML-% IV SOLN: 0.0000 ug/kg/min | INTRAVENOUS | Status: DC

## 2017-12-11 MED ORDER — NOREPINEPHRINE 4 MG/250ML-% IV SOLN
0.0000 ug/min | INTRAVENOUS | Status: DC
  Administered 2017-12-11: 5 ug/min via INTRAVENOUS
  Filled 2017-12-11: qty 250

## 2017-12-11 MED ORDER — CHLORHEXIDINE GLUCONATE 0.12% ORAL RINSE (MEDLINE KIT)
15.0000 mL | Freq: Two times a day (BID) | OROMUCOSAL | Status: DC
  Administered 2017-12-11: 15 mL via OROMUCOSAL

## 2017-12-11 MED ORDER — MIDAZOLAM HCL 2 MG/2ML IJ SOLN
2.0000 mg | INTRAMUSCULAR | Status: DC | PRN
Start: 2017-12-11 — End: 2017-12-11

## 2017-12-11 MED ORDER — INSULIN ASPART 100 UNIT/ML ~~LOC~~ SOLN: 2.0000 [IU] | SUBCUTANEOUS | Status: DC

## 2017-12-11 MED ORDER — SODIUM BICARBONATE 8.4 % IV SOLN
INTRAVENOUS | Status: AC
  Administered 2017-12-11: 50 meq via INTRAVENOUS
  Filled 2017-12-11: qty 50

## 2017-12-11 MED ORDER — POTASSIUM CHLORIDE 20 MEQ/15ML (10%) PO SOLN
40.0000 meq | Freq: Every day | ORAL | Status: DC
  Administered 2017-12-11: 40 meq via ORAL
  Filled 2017-12-11: qty 30

## 2017-12-11 MED ORDER — EPINEPHRINE PF 1 MG/10ML IJ SOSY
PREFILLED_SYRINGE | INTRAMUSCULAR | Status: AC
  Filled 2017-12-11: qty 40

## 2017-12-11 MED ORDER — SODIUM BICARBONATE 8.4 % IV SOLN
50.0000 meq | Freq: Once | INTRAVENOUS | Status: AC
  Administered 2017-12-11: 50 meq via INTRAVENOUS

## 2017-12-11 MED ORDER — SODIUM CHLORIDE 0.9 % IV SOLN
INTRAVENOUS | Status: DC
  Filled 2017-12-11: qty 1

## 2017-12-11 MED ORDER — ORAL CARE MOUTH RINSE
15.0000 mL | OROMUCOSAL | Status: DC
  Administered 2017-12-11: 15 mL via OROMUCOSAL

## 2017-12-11 MED ORDER — POTASSIUM CHLORIDE 10 MEQ/100ML IV SOLN
10.0000 meq | INTRAVENOUS | Status: AC
  Administered 2017-12-11 (×2): 10 meq via INTRAVENOUS
  Filled 2017-12-11 (×2): qty 100

## 2017-12-11 MED ORDER — EPINEPHRINE PF 1 MG/ML IJ SOLN
0.5000 ug/min | INTRAVENOUS | Status: DC
  Administered 2017-12-11: 18 ug/min via INTRAVENOUS
  Filled 2017-12-11 (×3): qty 4

## 2017-12-11 MED ORDER — FENTANYL BOLUS VIA INFUSION
25.0000 ug | INTRAVENOUS | Status: DC | PRN
  Filled 2017-12-11: qty 25

## 2017-12-11 MED ORDER — SUCCINYLCHOLINE CHLORIDE 20 MG/ML IJ SOLN
INTRAMUSCULAR | Status: DC | PRN
  Administered 2017-12-11: 100 mg via INTRAVENOUS

## 2017-12-11 MED ORDER — FENTANYL 2500MCG IN NS 250ML (10MCG/ML) PREMIX INFUSION: 25.0000 ug/h | INTRAVENOUS | Status: DC

## 2017-12-11 MED ORDER — ETOMIDATE 2 MG/ML IV SOLN
INTRAVENOUS | Status: DC | PRN
  Administered 2017-12-11: 10 mg via INTRAVENOUS

## 2017-12-11 MED ORDER — FENTANYL CITRATE (PF) 100 MCG/2ML IJ SOLN: 50.0000 ug | Freq: Once | INTRAMUSCULAR | Status: DC

## 2017-12-11 MED ORDER — MIDAZOLAM HCL 2 MG/2ML IJ SOLN: 2.0000 mg | INTRAMUSCULAR | Status: DC | PRN

## 2017-12-11 MED ORDER — DOCUSATE SODIUM 50 MG/5ML PO LIQD: 100.0000 mg | Freq: Two times a day (BID) | ORAL | Status: DC | PRN

## 2017-12-11 MED FILL — Medication: Qty: 1 | Status: AC

## 2017-12-12 LAB — HEMOGLOBIN A1C
Hgb A1c MFr Bld: 5.9 % — ABNORMAL HIGH (ref 4.8–5.6)
MEAN PLASMA GLUCOSE: 123 mg/dL

## 2017-12-12 LAB — URINE CULTURE: Special Requests: NORMAL

## 2017-12-27 NOTE — Procedures (Signed)
Extubation Procedure Note  Patient Details:   Name: Angelica Parker DOB: 12/04/1940 MRN: [id number]   Airway Documentation:   Patient expired on ventilator. Tube removed at this time. Airway 7.5 mm (Active)  Secured at (cm) 24 cm 01-09-18  4:40 AM  Measured From Lips 2018-01-09  4:40 AM  Secured Location Center 01/09/18  4:40 AM  Secured By [employer] 01-09-18  4:40 AM  Site Condition Dry 01-09-18  4:40 AM   Vent end date: 01-09-18 Vent end time: 1130   Evaluation  O2 sats: stable throughout Complications: No apparent complications Patient did tolerate procedure well. Bilateral Breath Sounds: Diminished   No  Suszanne Conners January 09, 2018, 11:34 AM

## 2017-12-27 NOTE — Progress Notes (Signed)
STAFF NOTE: I, Dr Lavinia Sharps have personally reviewed patient's available data, including medical history, events of note, physical examination and test results as part of my evaluation. I have discussed with resident/NP and other care providers such as pharmacist, RN and RRT.  In addition,  I personally evaluated patient and elicited key findings of   S: LOS 1 days. Cards primary . CCM consult. I was walking in to see patient and RN informed that with patient already on vent, levophed and epi gtt that she lost pulse on a line and BP . ECho tech had done echo 2 min earlier and noticed HR 110 or so with contractilkty +   O: On my exam - no pulse, No heart sounds Synch on vent Unresponsive on sedation  Got echo tech to do echo again - very very very faint contractility + and very bradycardic  Recent Labs  Lab 12/22/2017 1014 11/26/2017 0305 2017-12-12 0520 December 12, 2017 0527  HGB 14.0 13.5 10.9* 10.9*  HCT 42.6 41.9 35.1* 32.0*  WBC 7.2 11.0* 20.3*  --   PLT 246 247 184  --    Recent Labs  Lab 12/14/2017 1014 12/23/2017 0305 2017/12/12 0520 12/12/2017 0527  NA 136 138 138 138  K 3.5 3.2* 3.2* 3.2*  CL 98 98 102 101  CO2 25 30 16*  --   GLUCOSE 179* 154* 346* 327*  BUN 15 16 12 11   CREATININE 0.93 0.92 1.43* 1.00  CALCIUM 9.3 9.3 7.5*  --    Recent Labs  Lab 12-Dec-2017 0710  LATICACIDVEN 10.4*   No results for input(s): PROCALCITON in the last 168 hours.  Results for orders placed or performed during the hospital encounter of 12/08/2017  MRSA PCR Screening     Status: None   Collection Time: 12/26/2017  4:16 PM  Result Value Ref Range Status   MRSA by PCR NEGATIVE NEGATIVE Final    Comment:        The GeneXpert MRSA Assay (FDA approved for NASAL specimens only), is one component of a comprehensive MRSA colonization surveillance program. It is not intended to diagnose MRSA infection nor to guide or monitor treatment for MRSA infections. Performed at [facility], [address] [place], [place]   Urine Culture     Status: Abnormal (Preliminary result)   Collection Time: 12/13/2017  8:48 AM  Result Value Ref Range Status   Specimen Description URINE, CLEAN CATCH  Final   Special Requests Normal  Final   Culture (A)  Final    >=100,000 COLONIES/mL ESCHERICHIA COLI SUSCEPTIBILITIES TO FOLLOW Performed at [facility], [address], [place], [place]    Report Status PENDING  Incomplete     Recent Labs  Lab 12/13/2017 1409 12/17/2017 2128 12/04/2017 0305  TROPONINI 0.37* 3.96* 8.58*     A: e colii uti  MI  Circulatry shock - likely due to above Acute resp failure due to above PEA arrest - DNR status   P: dc pressors famiy informed by self and APP at bedside Ensure comfort Once death confirmed - extubate  Ccm will sign off - cards primary. APP informing Dr 12/12/17   .  Rest per NP/medical resident whose note is outlined above and that I agree with  The patient is critically ill with multiple organ systems failure and requires high complexity decision making for assessment and support, frequent evaluation and titration of therapies, application of advanced monitoring technologies and extensive interpretation of  multiple databases.   Critical Care Time devoted to patient care services described in this note is  30  Minutes. This time reflects time of care of this signee Dr Kalman Shan. This critical care time does not reflect procedure time, or teaching time or supervisory time of PA/NP/Med student/Med Resident etc but could involve care discussion time    Dr. Kalman Shan, M.D., [facility].C.P Pulmonary and Critical Care Medicine Staff Physician [facility] Pager: [phone], If no answer or between  15:00h - 7:00h: call [phone]  Dec 25, 2017 11:08 AM

## 2017-12-27 NOTE — Death Summary Note (Signed)
DEATH SUMMARY   Patient Details  Name: Angelica Parker MRN: [id number] DOB: 1940-11-13  Admission/Discharge Information   Admit Date:  01-Jan-2018  Date of Death:  01-04-2016  Time of Death:  11:30 am  Length of Stay: 1  Referring Physician: Lucky Cowboy, MD   Reason(s) for Hospitalization   77 yo female with HTN, HLD, RA and prediabetes, presents with midsternal chest pressure and bilateral arm pain, concerning for unstable angina. Ruled-in NSTEMI.  Diagnoses  Preliminary cause of death:   Acute myocardial infarction Secondary Diagnoses (including complications and co-morbidities):  Principal Problem:   Non-ST elevation (NSTEMI) myocardial infarction [facility]) Active Problems:   Hyperlipemia   HTN (hypertension)   Rheumatoid arthritis (HCC)   CKD (chronic kidney disease) stage 2, GFR 60-89 ml/min   COPD (chronic obstructive pulmonary disease) (HCC)   Pulmonary nodule   NSTEMI (non-ST elevated myocardial infarction) (HCC)   Acute respiratory failure (HCC)   Shock circulatory (HCC)   PEA (Pulseless electrical activity) [facility])   Brief Hospital Course (including significant findings, care, treatment, and services provided and events leading to death)  Angelica Parker is a 77 y.o. year old female who presented to [facility] after more than 1 day of midsternal chest pressure, bilateral arm pain and symptoms concerning for unstable angina.  She ruled in for an STEMI based on troponins which rose to 8.58.  She was treated with IV heparin and plans for cardiac catheterization.  That cardiac catheterization was performed on Monday, 12/23/2017 by Dr. Verdis Prime.  This demonstrated heavy three-vessel coronary artery calcification with a 30 to 40% ostial to mid left main disease, diffuse 60% narrowing of the LAD, 50% segmental narrowing in the large first diagonal.  There was a proximal eccentric 75% stenosis before the OM branch and the mid circumflex is totally occluded at the mid vessel.   Dominant right coronary artery was noted with 50 to 60% narrowing and there was anteroapical inferoapical hypokinesis with EF 45 to 50%.  Based on the late presentation and data from the OAT trial, aggressive risk factor modification and medical therapy was recommended.  Early this morning (Jan 03, 2018), she suffered a hypertensive cardiac arrest.  She was noted to be bradycardic and hypotensive requiring dopamine and epinephrine.  She had CPR for over 20 minutes and was successfully resuscitated.  She was intubated but required no sedation and did not follow commands when I saw her this morning.  Pupils were intact.  He underwent placement of a triple-lumen catheter today for IV access and her epinephrine was transitioned to Levophed.  Blood pressure was stable and the nurse noted that she developed bradycardia and hypotension, ultimately developing PEA arrest.  She was noted to be DNR after discussion with family they did not want to pursue more aggressive interventions.  Echocardiogram was in process during this event and will be interpreted later.  I suspect worsening coronary ischemia and acute MI of the cause of her death.  Pertinent Labs and Studies  Significant Diagnostic Studies Dg Chest 2 View  Result Date: 01-01-2018 CLINICAL DATA:  77 year old female with history of chest pain with radiation into the arms bilaterally. EXAM: CHEST - 2 VIEW COMPARISON:  Chest x-ray 06/02/2014. FINDINGS: Lung volumes are normal. No consolidative airspace disease. No pleural effusions. No pneumothorax. No pulmonary nodule or mass noted. Pulmonary vasculature and the cardiomediastinal silhouette are within normal limits. Aortic atherosclerosis IMPRESSION: 1.  No radiographic evidence of acute cardiopulmonary disease. 2. Aortic atherosclerosis. Electronically Signed  By: Trudie Reed M.D.   On: 11/27/2017 10:40   Dg Chest Port 1 View  Result Date: 12/22/2017 CLINICAL DATA:  Central line placement. EXAM: PORTABLE  CHEST 1 VIEW COMPARISON:  12/20/2017. FINDINGS: Right IJ line noted with tip over superior vena cava. Endotracheal tube, NG tube in stable position. Stable cardiomegaly. Persistent left-sided infiltrate/edema and left-sided pleural effusion. No interim change. No pneumothorax. IMPRESSION: 1. Right IJ line noted with tip over superior vena cava. Endotracheal tube and NG tube in stable position. 2. Persistent left-sided infiltrate/edema and left-sided pleural effusion. No interim change. 3.  Persistent cardiomegaly.  No interim change Electronically Signed   By: Maisie Fus  Register   On: 12/12/2017 10:26   Dg Chest Port 1 View  Result Date: 12/05/2017 CLINICAL DATA:  77 year old female with respiratory failure. Status post intubation. EXAM: PORTABLE CHEST 1 VIEW COMPARISON:  Chest radiograph dated 11/29/2017 FINDINGS: There has been interval placement of an endotracheal tube with tip approximately 4 cm above the carina. Enteric tube extends into the left hemiabdomen with tip beyond the inferior margin of the image. There has been interval development of an area of airspace opacity at the left lung base which may represent combination of pleural effusion and atelectasis/infiltrate. Left upper lobe vascular and interstitial prominence may represent asymmetric edema. The right lung is clear. No pneumothorax. Stable cardiac silhouette. No acute osseous pathology. IMPRESSION: 1. Endotracheal tube above the carina and enteric tube extends into the left hemiabdomen. 2. Interval development of left lung base opacity likely combination of infiltrate/atelectasis and pleural effusion. Asymmetric left upper lobe edema. Electronically Signed   By: Elgie Collard M.D.   On: 12/02/2017 05:41   Ct Angio Chest/abd/pel For Dissection W And/or W/wo  Result Date: 12/12/2017 CLINICAL DATA:  77 year old female with history of chest pain and radiating into the arms bilaterally since 1 a.m. this morning. EXAM: CT ANGIOGRAPHY CHEST,  ABDOMEN AND PELVIS TECHNIQUE: Multidetector CT imaging through the chest, abdomen and pelvis was performed using the standard protocol during bolus administration of intravenous contrast. Multiplanar reconstructed images and MIPs were obtained and reviewed to evaluate the vascular anatomy. CONTRAST:  ISOVUE-370 IOPAMIDOL (ISOVUE-370) INJECTION 76% COMPARISON:  No priors. FINDINGS: CTA CHEST FINDINGS Cardiovascular: No crescentic high attenuation noted associated with the thoracic aorta to suggest acute intramural hemorrhage. No evidence of thoracic aortic aneurysm or dissection on CTA images. Heart size is normal. There is no significant pericardial fluid, thickening or pericardial calcification. There is aortic atherosclerosis, as well as atherosclerosis of the great vessels of the mediastinum and the coronary arteries, including calcified atherosclerotic plaque in the left main, left anterior descending, left circumflex and right coronary arteries. Mild calcifications of the aortic valve. Mediastinum/Nodes: No pathologically enlarged mediastinal or hilar lymph nodes. Small hiatal hernia. No axillary lymphadenopathy. Lungs/Pleura: 11 x 9 mm right upper lobe nodule (axial image 60 of series 9) with slightly ill-defined margins. No acute consolidative airspace disease. No pleural effusions. Linear areas of scarring and/or atelectasis in the anterior aspect of the left lower lobe and medial segment of the right middle lobe. Musculoskeletal: There are no aggressive appearing lytic or blastic lesions noted in the visualized portions of the skeleton. Review of the MIP images confirms the above findings. CTA ABDOMEN AND PELVIS FINDINGS VASCULAR Aorta: Normal caliber aorta without aneurysm, dissection, vasculitis or significant stenosis. Celiac: Patent without evidence of aneurysm, dissection, vasculitis or significant stenosis. SMA: Patent without evidence of aneurysm, dissection, vasculitis or significant stenosis.  Renals: Both renal arteries  are patent without evidence of aneurysm, dissection, vasculitis, fibromuscular dysplasia or significant stenosis. IMA: Patent without evidence of aneurysm, dissection, vasculitis or significant stenosis. Inflow: Patent without evidence of aneurysm, dissection, vasculitis or significant stenosis. Veins: No obvious venous abnormality within the limitations of this arterial phase study. Review of the MIP images confirms the above findings. NON-VASCULAR Hepatobiliary: No suspicious cystic or solid hepatic lesions. No intra or extrahepatic biliary ductal dilatation. Status post cholecystectomy. Pancreas: No pancreatic mass. No pancreatic ductal dilatation. No pancreatic or peripancreatic fluid or inflammatory changes. Spleen: Unremarkable. Adrenals/Urinary Tract: 2.5 cm simple cyst in the interpolar region of the right kidney. Left kidney and left adrenal gland are normal in appearance. Large partially calcified right adrenal mass which has internal areas of low attenuation (3 HU) measuring 4.4 x 3.2 cm, most compatible with a large adenoma. No hydroureteronephrosis. Urinary bladder is normal in appearance. Stomach/Bowel: Normal appearance of the stomach. No pathologic dilatation of small bowel or colon. 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 The appendix is not confidently identified and may be surgically absent. Regardless, there are no inflammatory changes noted adjacent to the cecum to suggest the presence of an acute appendicitis at this time. Lymphatic: Mildly enlarged para-aortic lymph node in the left retroperitoneum measuring 12 mm in short axis (axial image 211 of series 8). No other lymphadenopathy is noted elsewhere in the abdomen or pelvis. Reproductive: Uterus and ovaries are unremarkable in appearance. Other: No significant volume of ascites.  No  pneumoperitoneum. Musculoskeletal: There are no aggressive appearing lytic or blastic lesions noted in the visualized portions of the skeleton. Review of the MIP images confirms the above findings. IMPRESSION: 1. No acute findings are noted in the chest, abdomen or pelvis to account for the patient's symptoms. Specifically, no evidence of acute aortic syndrome. 2. 11 x 9 mm right upper lobe nodule which has slightly ill-defined margins. The possibility of primary bronchogenic neoplasm should be considered. Consider one of the following in 3 months for both low-risk and high-risk individuals: (a) repeat chest CT or (b) follow-up PET-CT. This recommendation follows the consensus statement: Guidelines for Management of Incidental Pulmonary Nodules Detected on CT Images: From the Fleischner Society 2017; Radiology 2017; 284:228-243. 3. Mildly enlarged left para-aortic lymph node measuring 12 mm in short axis. This is nonspecific, but attention is is recommended on any future imaging studies to ensure the stability of this finding. 4. Large right adrenal adenoma. 5. Aortic atherosclerosis, in addition to left main and 3 vessel coronary artery disease. Assessment for potential risk factor modification, dietary therapy or pharmacologic therapy may be warranted, if clinically indicated. 6. There are calcifications of the aortic valve. Echocardiographic correlation for evaluation of potential valvular dysfunction may be warranted if clinically indicated. 7. Additional incidental findings, as above. Electronically Signed   By: Trudie Reed M.D.   On: 12/18/2017 11:42    Microbiology Recent Results (from the past 240 hour(s))  MRSA PCR Screening     Status: None   Collection Time: 12/20/2017  4:16 PM  Result Value Ref Range Status   MRSA by PCR NEGATIVE NEGATIVE Final    Comment:        The GeneXpert MRSA Assay (FDA approved for NASAL specimens only), is one component of a comprehensive MRSA  colonization surveillance program. It is not intended to diagnose MRSA infection nor to guide or monitor treatment for MRSA infections. Performed  at [facility], [address] [place], [place] [address]   Urine Culture     Status: Abnormal (Preliminary result)   Collection Time: 12/05/2017  8:48 AM  Result Value Ref Range Status   Specimen Description URINE, CLEAN CATCH  Final   Special Requests Normal  Final   Culture (A)  Final    >=100,000 COLONIES/mL ESCHERICHIA COLI SUSCEPTIBILITIES TO FOLLOW Performed at [facility], [address] [place], [place] [address]    Report Status PENDING  Incomplete    Lab Basic Metabolic Panel: Recent Labs  Lab 12/24/2017 1014 12/08/2017 0305 12/08/2017 0520 12/14/2017 0527  NA 136 138 138 138  K 3.5 3.2* 3.2* 3.2*  CL 98 98 102 101  CO2 25 30 16*  --   GLUCOSE 179* 154* 346* 327*  BUN 15 16 12 11   CREATININE 0.93 0.92 1.43* 1.00  CALCIUM 9.3 9.3 7.5*  --    Liver Function Tests: No results for input(s): AST, ALT, ALKPHOS, BILITOT, PROT, ALBUMIN in the last 168 hours. No results for input(s): LIPASE, AMYLASE in the last 168 hours. No results for input(s): AMMONIA in the last 168 hours. CBC: Recent Labs  Lab 12/05/2017 1014 12/13/2017 0305 12/18/2017 0520 11/30/2017 0527  WBC 7.2 11.0* 20.3*  --   HGB 14.0 13.5 10.9* 10.9*  HCT 42.6 41.9 35.1* 32.0*  MCV 93.2 93.7 98.3  --   PLT 246 247 184  --    Cardiac Enzymes: Recent Labs  Lab December 10, 2017 1409 12/15/2017 2128 12/20/2017 0305  TROPONINI 0.37* 3.96* 8.58*   Sepsis Labs: Recent Labs  Lab December 10, 2017 1014 12/14/2017 0305 12/22/2017 0520 11/26/2017 0710  WBC 7.2 11.0* 20.3*  --   LATICACIDVEN  --   --   --  10.4*    Procedures/Operations   LEFT HEART CATH AND CORONARY ANGIOGRAPHY  Conclusion    Heavy three-vessel coronary artery calcification  30 to 40% ostial to mid left main  Diffuse segmental 60% proximal to mid LAD narrowing.  Segmental 50% narrowing in the large  first diagonal.  Proximal eccentric 75% stenosis in the circumflex before the first OM.  First obtuse marginal contains eccentric 70% narrowing and tortuosity.  Mid circumflex is totally occluded without collaterals to the distal vessel -and represents the culprit for presentation.  Dominant right coronary.  Diffuse segmental 50 to 60% narrowing within the mid RCA.  Anteroapical and inferoapical mild to moderate hypokinesis.  EF 45 to 50%.  Mildly elevated LVEDP of 19 mmHg.  Recommendations:   Aggressive risk factor modification  Consider 6 to 12 months duration dual antiplatelet therapy with aspirin and Plavix if bleeding risk is not excessive.  Medical therapy for residual coronary disease.  Recommend uninterrupted dual antiplatelet therapy with Aspirin 81mg  daily and Clopidogrel 75mg  daily for a minimum of 6 months (stable ischemic heart disease - Class I recommendation).   Central Venous Catheter Insertion Procedure Note OMOLOLA HUCKINS [id number] [birth year]  Procedure: Insertion of Central Venous Catheter Indications: Assessment of intravascular volume, Drug and/or fluid administration and Frequent blood sampling  Procedure Details Consent: Risks of procedure as well as the alternatives and risks of each were explained to the (patient/caregiver).  Consent for procedure obtained. Time Out: Verified patient identification, verified procedure, site/side was marked, verified correct patient position, special equipment/implants available, medications/allergies/relevent history reviewed, required imaging and test results available.  Performed  Maximum sterile technique was used including antiseptics, cap, gloves, gown, hand hygiene, mask and sheet. Skin prep:  Chlorhexidine; local anesthetic administered A antimicrobial bonded/coated triple lumen catheter was placed in the right internal jugular vein using the Seldinger technique.  Evaluation Blood flow good Complications: No  apparent complications Patient did tolerate procedure well. Chest X-ray ordered to verify placement.  CXR: pending.  Ultrasound guidance was used for real time visualization of vessel cannulation. Procedure was performed under the direct supervision of Canary Brim NP and Dr. Marchelle Gearing.   [name] 12/24/2017, 10:11 AM  Chrystie Nose, MD, Milagros Loll  [place]  [facility]  Medical Director of the [facility]  Card[facility]ovascular Risk Reduction [facility] Diplomate of the American Board of Clinical Lipidology Attending Cardiologist  Direct Dial: [phone]  Fax: [phone]  Website:  [email or web address] 11/27/2017, 11:43 AM

## 2017-12-27 NOTE — Procedures (Signed)
Central Venous Catheter Insertion Procedure Note Angelica Parker [id number] 28-Mar-1941  Procedure: Insertion of Central Venous Catheter Indications: Assessment of intravascular volume, Drug and/or fluid administration and Frequent blood sampling  Procedure Details Consent: Risks of procedure as well as the alternatives and risks of each were explained to the (patient/caregiver).  Consent for procedure obtained. Time Out: Verified patient identification, verified procedure, site/side was marked, verified correct patient position, special equipment/implants available, medications/allergies/relevent history reviewed, required imaging and test results available.  Performed  Maximum sterile technique was used including antiseptics, cap, gloves, gown, hand hygiene, mask and sheet. Skin prep: Chlorhexidine; local anesthetic administered A antimicrobial bonded/coated triple lumen catheter was placed in the right internal jugular vein using the Seldinger technique.  Evaluation Blood flow good Complications: No apparent complications Patient did tolerate procedure well. Chest X-ray ordered to verify placement.  CXR: pending.  Ultrasound guidance was used for real time visualization of vessel cannulation. Procedure was performed under the direct supervision of Angelica Brim NP and Dr. Marchelle Parker.   Angelica Parker 12/12/2017, 10:11 AM

## 2017-12-27 NOTE — Progress Notes (Addendum)
[place] PCCM AM Follow Up   Brief History:  77 yr old female with PMHx significant for RA, HLD, HTN p/w chest pain on 12/06/2017 found to have NSTEMI started on Heparin ggt. LHC on 12-16-2017 showed occlusion of mid circumflex without collaterals. At 3 AM pt was hypotensive and subsequently arrested (PEA).  Received approximately 20 minutes CPR / ACLS before ROSC achieved. Now intubated and on vasopressors. PCCM consulted for management.     S: On no sedation, unresponsive, Pupils PERRL 4 mm brisk bilaterally Gag & cough reflexes present, breathing over vent PRVC: 80%, 5 Peep Low grade temps On Epinephrine gtt  O: Blood pressure 117/66, pulse (!) 125, temperature 99.9 F (37.7 C), resp. rate (!) 22, height 5\' 3"  (1.6 m), weight 189 lb 3.2 oz (85.8 kg), SpO2 99 %.  General: Critically ill appearing female, laying in bed, intubated, in no acute distress  HEENT: Atraumatic, normocephalic, neck supple, no JVD, ETT in place, MM pink/moist Neuro: Unresponsive on no sedation, Pupils PERRL 4 mm brisk bilaterally, cough /gag reflex present, breathing over vent CV: Tachycardia, RRR, s1s2, no m/r/g PULM: Coarse breath sounds bilaterally, even/non-labored, Vent assisted 08-02-1996, non-tender, bsx4 active  Extremities: warm/dry, No deformities,1+edema  Skin: no obvious rashes, lesions, or ulcerations  CBC Latest Ref Rng & Units 11/29/2017 12/17/2017 2017/12/16  WBC 4.0 - 10.5 K/uL - 20.3(H) 11.0(H)  Hemoglobin 12.0 - 15.0 g/dL 10.9(L) 10.9(L) 13.5  Hematocrit 36.0 - 46.0 % 32.0(L) 35.1(L) 41.9  Platelets 150 - 400 K/uL - 184 247   BMP Latest Ref Rng & Units 12/18/2017 12/14/2017 2017/12/16  Glucose 70 - 99 mg/dL 12/12/2017) 413(K) 440(N)  BUN 8 - 23 mg/dL 11 12 16   Creatinine 0.44 - 1.00 mg/dL 027(O ) 5.36  BUN/Creat Ratio 6 - 22 (calc) - - -  Sodium 135 - 145 mmol/L 138 138 138  Potassium 3.5 - 5.1 mmol/L 3.2(L) 3.2(L) 3.2(L)  Chloride 98 - 111 mmol/L 101 102 98  CO2 22 - 32 mmol/L - 16(L) 30   Calcium 8.9 - 10.3 mg/dL - 7.5(L) 9.3    A/P:  NEURO A: Acute encephalopathy following PEA arrest (approx. 20 minutes) -Unresponsive to noxious stimuli, Intact gag/cough relfex, Pupils PERRL P: RASS goal: 0 to -1 Fentanyl gtt to maintain RASS goal Avoid sedating meds as able Frequent Neurochecks If pt Neuro exam not improving, consider obtaining Head CT Provide supportive care  CARDIAC A:  Shock (Cardiogenic +/- septic) Acute Coronary Thrombosis NSTEMI>>Evolving inferior MI  (Is this a new occlusion superimposed on previous circumflex occlusion?) P: Cardiology following, appreciate input Maintain MAP >65 Epinephrine currently infusing, transition to Levophed to maintain MAP goal Heparin gtt for anticoagulation Monitor CVP  PULMONARY A: Acute Hypoxic Respiratory Failure P: PRVC: 8 cc/kg Maintain O2 sats >92% Wean FiO2 as tolerated Follow ABG & CXR Currently holding off on weaning for now- observing for neurologic recovery  INFECTIOUS A: UTI -UC 7/15 growing Escherichia coli Leukocytosis P: Monitor fever curve Trend WBC's Pan culture Will start empiric Rocephin  ENDOCRINE A: Hyperglycemia P: CBG's Insulin gtt Follow ICU Hyper/hypoglycemia protocol  GI A: No active issues P: NPO Consider starting TF if pt remains intubated >24 hrs Pepcid for SUP  HEMATOLOGY A: Anemia with no signs of active bleeding P: Monitor for s/sx of bleeding Trend CBC Heparin gtt for anticoagulation (NSTEMI) Transfuse for Hgb<8  RENAL A: AKI Anion Gap Metabolic Acidosis/ Lactic Acidosis Hypokalemia P: Monitor I&O's / urinary output Follow BMP Ensure adequate renal perfusion Avoid nephrotoxic agents  as able Replace electrolytes as indicated Trend Lactic Follow CVP, if <8 will add IVF  Please see complete PCCM Consult Note from this am 12/08/2017 for full details on Assessment/Plan.   Harlon Ditty, AGACNP-BC [place] [facility]

## 2017-12-27 NOTE — Progress Notes (Signed)
Check echo for new or worsening wall motion abnormalities.  Prognosis remains guarded.  I discussed the case with Dr. Tamala Julian and reviewed the cath images personally, he does not feel that additional intervention is warranted.  CRITICAL CARE TIME: I have spent a total of 45 minutes with patient reviewing hospital notes, telemetry, EKGs, labs and examining the patient as well as establishing an assessment and plan that was discussed with the patient. > 50% of time was spent in direct patient care. The patient is critically ill with multi-organ system failure and requires high complexity decision making for assessment and support, frequent evaluation and titration of therapies, application of advanced monitoring technologies and extensive interpretation of multiple databases.  Length of Stay:  LOS: 1 day   Angelica Casino, MD, [facility], [place] Director of the [facility] &  [facility] Diplomate of the American Board of Clinical  Lipidology Attending Cardiologist  Direct Dial: [phone]  Fax: [phone]  Website:  [email or web address]  Angelica Parker Angelica Parker 12/26/17, 8:14 AM  Check echo for new or worsening wall motion abnormalities.  Prognosis remains guarded.  I discussed the case with Dr. Tamala Julian and reviewed the cath images personally, he does not feel that additional intervention is warranted.  CRITICAL CARE TIME: I have spent a total of 45 minutes with patient reviewing hospital notes, telemetry, EKGs, labs and examining the patient as well as establishing an assessment and plan that was discussed with the patient. > 50% of time was spent in direct patient care. The patient is critically ill with multi-organ system failure and requires high complexity decision making for assessment and support, frequent evaluation and titration of therapies, application of advanced monitoring technologies and extensive interpretation of multiple databases.  Length of Stay:  LOS: 1 day   Angelica Casino, MD, [facility], [place] Director of the [facility] &  Cardiovascular [facility] Reduction Clinic Diplomate of the American Board of Clinical  Lipidology Attending Cardiologist  Direct Dial: [phone]  Fax: [phone]  Website:  [email or web address]  Angelica Parker Angelica Parker 12/26/17, 8:14 AM  Check echo for new or worsening wall motion abnormalities.  Prognosis remains guarded.  I discussed the case with Dr. Tamala Julian and reviewed the cath images personally, he does not feel that additional intervention is warranted.  CRITICAL CARE TIME: I have spent a total of 45 minutes with patient reviewing hospital notes, telemetry, EKGs, labs and examining the patient as well as establishing an assessment and plan that was discussed with the patient. > 50% of time was spent in direct patient care. The patient is critically ill with multi-organ system failure and requires high complexity decision making for assessment and support, frequent evaluation and titration of therapies, application of advanced monitoring technologies and extensive interpretation of multiple databases.  Length of Stay:  LOS: 1 day   Angelica Casino, MD, [facility], [place] Director of the [facility] Lipid [facility] &  Cardiovascular Risk Reduction Clinic Diplomate of the American Board of Clinical  Lipidology Attending Cardiologist  Direct Dial: [phone]  Fax: [phone]  Website:  [email or web address]  Angelica Parker Angelica Parker 12/26/17, 8:14 AM  Status: Abnormal   Collection Time: 12/12/2017 10:14 AM  Result Value Ref Range   Sodium 136 135 - 145 mmol/L   Potassium 3.5 3.5 - 5.1 mmol/L   Chloride 98 98 - 111 mmol/L    Comment: Please note change in reference range.   CO2 25 22 - 32 mmol/L   Glucose, Bld 179 (H) 70 - 99 mg/dL    Comment: Please note change in reference range.   BUN 15 8 - 23 mg/dL    Comment: Please note change in reference range.   Creatinine, Ser 0.93 0.44 - 1.00 mg/dL   Calcium 9.3 8.9 - 10.3 mg/dL   GFR calc non Af Amer 57 (L) >60 mL/min   GFR calc Af Amer >60  >60 mL/min    Comment: (NOTE) The eGFR has been calculated using the CKD EPI equation. This calculation has not been validated in all clinical situations. eGFR's persistently <60 mL/min signify possible Chronic Kidney Disease.    Anion gap 13 5 - 15    Comment: Performed at [facility] [address] [place] [address]  CBC     Status: None   Collection Time: 12/23/2017 10:14 AM  Result Value Ref Range   WBC 7.2 4.0 - 10.5 K/uL   RBC 4.57 3.87 - 5.11 MIL/uL   Hemoglobin 14.0 12.0 - 15.0 g/dL   HCT 42.6 36.0 - 46.0 %   MCV 93.2 78.0 - 100.0 fL   MCH 30.6 26.0 - 34.0 pg   MCHC 32.9 30.0 - 36.0 g/dL   RDW 14.4 11.5 - 15.5 %   Platelets 246 150 - 400 K/uL    Comment: Performed at [facility], [place] [address] [place], [place] [address]  I-stat troponin, ED     Status: Abnormal   Collection Time: 12/26/2017 10:22 AM  Result Value Ref Range   Troponin i, poc 0.24 (HH) 0.00 - 0.08 ng/mL   Comment NOTIFIED PHYSICIAN    Comment 3            Comment: Due to the release kinetics of cTnI, a negative result within the first hours of the onset of symptoms does not rule out myocardial infarction with certainty. If myocardial infarction is still suspected, repeat the test at appropriate intervals.   Troponin I     Status: Abnormal   Collection Time: 12/08/2017  2:09 PM  Result Value Ref Range   Troponin I 0.37 (HH) <0.03 ng/mL    Comment: CRITICAL RESULT CALLED TO, READ BACK BY AND VERIFIED WITH: K  COBB  RN @ 6213 ON 12/15/2017 BY HTEMOCHE Performed at [facility], [place] [address], [place], [place] [address]   MRSA PCR Screening     Status: None   Collection Time: 12/23/2017  4:16 PM  Result Value Ref Range   MRSA by PCR NEGATIVE NEGATIVE    Comment:        The GeneXpert MRSA Assay (FDA approved for NASAL specimens only), is one component of a comprehensive MRSA colonization surveillance program. It is not intended to diagnose MRSA infection nor to guide  or monitor treatment for MRSA infections. Performed at [facility], [place] [address], [place], [place] [address]   Troponin I     Status: Abnormal   Collection Time: 11/27/2017  9:28 PM  Result Value Ref Range   Troponin I 3.96 (HH) <0.03 ng/mL    Comment: CRITICAL RESULT CALLED TO, READ BACK BY AND  Check echo for new or worsening wall motion abnormalities.  Prognosis remains guarded.  I discussed the case with Dr. Tamala Julian and reviewed the cath images personally, he does not feel that additional intervention is warranted.  CRITICAL CARE TIME: I have spent a total of 45 minutes with patient reviewing hospital notes, telemetry, EKGs, labs and examining the patient as well as establishing an assessment and plan that was discussed with the patient. > 50% of time was spent in direct patient care. The patient is critically ill with multi-organ system failure and requires high complexity decision making for assessment and support, frequent evaluation and titration of therapies, application of advanced monitoring technologies and extensive interpretation of multiple databases.  Length of Stay:  LOS: 1 day   Angelica Casino, MD, [facility], [place] Director of the Advanced Lipid Disorders &  Cardiovascular Risk Reduction Clinic Diplomate of the American Board of Clinical  Lipidology Attending Cardiologist  Direct Dial: [phone]  Fax: [phone]  Website:  [email or web address]  Angelica Parker Angelica Parker 12/26/17, 8:14 AM  Check echo for new or worsening wall motion abnormalities.  Prognosis remains guarded.  I discussed the case with Dr. Tamala Julian and reviewed the cath images personally, he does not feel that additional intervention is warranted.  CRITICAL CARE TIME: I have spent a total of 45 minutes with patient reviewing hospital notes, telemetry, EKGs, labs and examining the patient as well as establishing an assessment and plan that was discussed with the patient. > 50% of time was spent in direct patient care. The patient is critically ill with multi-organ system failure and requires high complexity decision making for assessment and support, frequent evaluation and titration of therapies, application of advanced monitoring technologies and extensive interpretation of multiple databases.  Length of Stay:  LOS: 1 day   Angelica Casino, MD, [facility], [place] Director of the [facility] Lipid Disorders &  Cardiovascular Risk Reduction Clinic Diplomate of the American Board of Clinical  Lipidology Attending Cardiologist  Direct Dial: [phone]  Fax: [phone]  Website:  [email or web address]  Angelica Parker Angelica Parker 12/26/17, 8:14 AM  GFR calc Af Amer 40 (L) >60 mL/min    Comment: (NOTE) The eGFR has been calculated using the CKD EPI equation. This calculation has not been validated in all clinical situations. eGFR's  persistently <60 mL/min signify possible Chronic Kidney Disease.    Anion gap 20 (H) 5 - 15    Comment: Performed at [facility], [place] [address] [place], [place] [address]  CBC     Status: Abnormal   Collection Time: 01/05/2018  5:20 AM  Result Value Ref Range   WBC 20.3 (H) 4.0 - 10.5 K/uL   RBC 3.57 (L) 3.87 - 5.11 MIL/uL   Hemoglobin 10.9 (L) 12.0 - 15.0 g/dL   HCT 35.1 (L) 36.0 - 46.0 %   MCV 98.3 78.0 - 100.0 fL   MCH 30.5 26.0 - 34.0 pg   MCHC 31.1 30.0 - 36.0 g/dL   RDW 14.8 11.5 - 15.5 %   Platelets 184 150 - 400 K/uL    Comment: Performed at [facility] [address], [place], [place] [address]  I-STAT 3, arterial blood gas (G3+)     Status: Abnormal   Collection Time: 2018-01-05  5:27 AM  Result Value Ref Range   pH, Arterial 7.263 (L) 7.350 - 7.450   pCO2 arterial 39.5 32.0 - 48.0 mmHg   pO2, Arterial 301.0 (H) 83.0 - 108.0 mmHg   Bicarbonate 18.0 (L) 20.0 - 28.0 mmol/L   TCO2 19 (L) 22 - 32 mmol/L   O2 Saturation 100.0 %   Acid-base deficit 9.0 (H) 0.0 - 2.0 mmol/L   Patient temperature 97.7 F    Collection site ARTERIAL LINE    Drawn by Nurse    Sample type ARTERIAL   I-STAT, chem 8     Status: Abnormal   Collection Time: 05-Jan-2018  5:27 AM  Result Value Ref Range   Sodium 138 135 - 145 mmol/L   Potassium 3.2 (L) 3.5 - 5.1 mmol/L   Chloride 101 98 - 111 mmol/L   BUN 11 8 - 23 mg/dL   Creatinine, Ser 1.00 0.44 - 1.00 mg/dL   Glucose, Bld 327 (H) 70 - 99 mg/dL   Calcium, Ion 1.02 (L) 1.15 - 1.40 mmol/L   TCO2 19 (L) 22 - 32 mmol/L   Hemoglobin 10.9 (L) 12.0 - 15.0 g/dL   HCT 32.0 (L) 36.0 - 46.0 %  I-STAT 3, arterial blood gas (G3+)     Status: Abnormal   Collection Time: 01/05/18  6:56 AM  Result Value Ref Range   pH, Arterial 7.300 (L) 7.350 - 7.450   pCO2 arterial 36.0 32.0 - 48.0 mmHg   pO2, Arterial 77.0 (L) 83.0 - 108.0 mmHg   Bicarbonate 17.8 (L) 20.0 - 28.0 mmol/L   TCO2 19 (L) 22 - 32 mmol/L   O2 Saturation 94.0 %   Acid-base  deficit 8.0 (H) 0.0 - 2.0 mmol/L   Patient temperature 36.4 C    Collection site ARTERIAL LINE    Drawn by Nurse    Sample type ARTERIAL     ECG   Post arrest EKG showed A. fib with RVR and inferior injury pattern -personally reviewed  Telemetry   Sinus tachycardia- Personally Reviewed  Radiology    Dg Chest 2 View  Result Date: 11/27/2017 CLINICAL DATA:  77 year old female with history of chest pain with radiation into the arms bilaterally. EXAM: CHEST - 2 VIEW COMPARISON:  Chest x-ray 06/02/2014. FINDINGS: Lung volumes are normal. No consolidative airspace  Status: Abnormal   Collection Time: 12/12/2017 10:14 AM  Result Value Ref Range   Sodium 136 135 - 145 mmol/L   Potassium 3.5 3.5 - 5.1 mmol/L   Chloride 98 98 - 111 mmol/L    Comment: Please note change in reference range.   CO2 25 22 - 32 mmol/L   Glucose, Bld 179 (H) 70 - 99 mg/dL    Comment: Please note change in reference range.   BUN 15 8 - 23 mg/dL    Comment: Please note change in reference range.   Creatinine, Ser 0.93 0.44 - 1.00 mg/dL   Calcium 9.3 8.9 - 10.3 mg/dL   GFR calc non Af Amer 57 (L) >60 mL/min   GFR calc Af Amer >60  >60 mL/min    Comment: (NOTE) The eGFR has been calculated using the CKD EPI equation. This calculation has not been validated in all clinical situations. eGFR's persistently <60 mL/min signify possible Chronic Kidney Disease.    Anion gap 13 5 - 15    Comment: Performed at [facility] [address] [place] [address]  CBC     Status: None   Collection Time: 12/23/2017 10:14 AM  Result Value Ref Range   WBC 7.2 4.0 - 10.5 K/uL   RBC 4.57 3.87 - 5.11 MIL/uL   Hemoglobin 14.0 12.0 - 15.0 g/dL   HCT 42.6 36.0 - 46.0 %   MCV 93.2 78.0 - 100.0 fL   MCH 30.6 26.0 - 34.0 pg   MCHC 32.9 30.0 - 36.0 g/dL   RDW 14.4 11.5 - 15.5 %   Platelets 246 150 - 400 K/uL    Comment: Performed at [facility], [place] [address] [place], [place] [address]  I-stat troponin, ED     Status: Abnormal   Collection Time: 12/26/2017 10:22 AM  Result Value Ref Range   Troponin i, poc 0.24 (HH) 0.00 - 0.08 ng/mL   Comment NOTIFIED PHYSICIAN    Comment 3            Comment: Due to the release kinetics of cTnI, a negative result within the first hours of the onset of symptoms does not rule out myocardial infarction with certainty. If myocardial infarction is still suspected, repeat the test at appropriate intervals.   Troponin I     Status: Abnormal   Collection Time: 12/08/2017  2:09 PM  Result Value Ref Range   Troponin I 0.37 (HH) <0.03 ng/mL    Comment: CRITICAL RESULT CALLED TO, READ BACK BY AND VERIFIED WITH: K  COBB  RN @ 6213 ON 12/15/2017 BY HTEMOCHE Performed at [facility], [place] [address], [place], [place] [address]   MRSA PCR Screening     Status: None   Collection Time: 12/23/2017  4:16 PM  Result Value Ref Range   MRSA by PCR NEGATIVE NEGATIVE    Comment:        The GeneXpert MRSA Assay (FDA approved for NASAL specimens only), is one component of a comprehensive MRSA colonization surveillance program. It is not intended to diagnose MRSA infection nor to guide  or monitor treatment for MRSA infections. Performed at [facility], [place] [address], [place], [place] [address]   Troponin I     Status: Abnormal   Collection Time: 11/27/2017  9:28 PM  Result Value Ref Range   Troponin I 3.96 (HH) <0.03 ng/mL    Comment: CRITICAL RESULT CALLED TO, READ BACK BY AND

## 2017-12-27 NOTE — Progress Notes (Signed)
Family called and made aware.  Son states he is on his way.

## 2017-12-27 NOTE — Consult Note (Addendum)
.. ..  Name: Angelica Parker MRN: [id number] DOB: 1940-11-12    ADMISSION DATE:  12/06/2017 CONSULTATION DATE:  12/16/2017  REFERRING MD :  Rennis Golden MD  CHIEF COMPLAINT:  S/p cardiac arrest   BRIEF PATIENT DESCRIPTION: 77 yr old female with PMHx significant for RA, HLD, HTN p/w chest pain on 12/04/2017 found to have NSTEMI started on Heparin ggt. LHC on 12-17-17 showed occlusion of mid circumflex without collaterals. At 3 AM pt was hypotensive and subsequently arrested. Now intubated and on vasopressors. PCCM consulted for management.   SIGNIFICANT EVENTS  LHC- occlusion of mid circumflex without collaterals S/p cardiac arrest   STUDIES:  LHC:  Heavy three-vessel coronary artery calcification  30 to 40% ostial to mid left main  Diffuse segmental 60% proximal to mid LAD narrowing.  Segmental 50% narrowing in the large first diagonal.  Proximal eccentric 75% stenosis in the circumflex before the first OM.  First obtuse marginal contains eccentric 70% narrowing and tortuosity.  Mid circumflex is totally occluded without collaterals to the distal vessel -and represents the culprit for presentation.  Dominant right coronary.  Diffuse segmental 50 to 60% narrowing within the mid RCA.  Anteroapical and inferoapical mild to moderate hypokinesis.  EF 45 to 50%.  Mildly elevated LVEDP of 19 mmHg.    HISTORY OF PRESENT ILLNESS:   ( History obtained from EMR, family and acct of other providers) 77 yr old female with PMHx significant for RA, HLD, HTN,COPD, prediabetes p/w chest pressure located midsternal and extending to both arms concerning for unstable angina on 11/28/2017.  Her troponins peaked at 8.58 and she was managed for NSTEMI with a Heparin ggt. Cardiology evaluated the patient and took her for [facility] on 17-Dec-2017 which showed multivessel calcification with some 70% stenosis in proximal circumflex and the most significant finding being complete occlusion of mid circumflex without collaterals to the  distal vessel.   At 3 AM pt was hypotensive the Nitroglycerin ggt was stopped and pt was not arousable to verbal or noxious stimuli.  Rapid was called  EKG showed diffuse ST elevationsin inferior and anterior leads.  Pt remained unresponsive and unable to protect her airway  Decision made to intubate, in the interim pt became bradycardic and went into PEA arrest. Duration of code event 20 mins. Post code she had + gag and reactive equal pupils per Dr Charlestine Night documentation.  Currently  intubated and on vasopressors ( dopa and epi) with Heparin ggt. PCCM consulted for management  PAST MEDICAL HISTORY :   has a past medical history of Allergy, Cataract (05/04/2017), Hyperlipidemia, Hypertension, Prediabetes, Rheumatoid arthritis (HCC), Thyroid disease, and Vitamin D deficiency.  has a past surgical history that includes Cochlear implant (Right, 2007); Appendectomy; Tubal ligation (Bilateral); Cholecystectomy; Other surgical history; Ankle arthroscopy with open reduction internal fixation (orif) (Right, 2004); Other surgical history; and Cataract extraction, bilateral (Bilateral, 02/2017). Prior to Admission medications   Medication Sig Start Date End Date Taking? Authorizing Provider  Adalimumab (HUMIRA PEN) 40 MG/0.8ML PNKT  11/09/14  Yes [provider]  aspirin EC 81 MG tablet Take 81 mg by mouth.   Yes [provider]  calcium carbonate (OS-CAL) 600 MG TABS tablet Take 600 mg by mouth 2 (two) times daily with a meal.   Yes [provider]  Cholecalciferol (VITAMIN D3) 2000 units capsule Take 2,000 Units by mouth daily.    Yes [provider]  citalopram (CELEXA) 40 MG tablet TAKE 1 TABLET BY MOUTH EVERY DAY 08/29/17  Yes Quentin Mulling, PA-C  folic acid (FOLVITE) 1 MG tablet Take by mouth daily.   Yes [provider]  hydrochlorothiazide (HYDRODIURIL) 25 MG tablet TAKE 1 TABLET BY MOUTH EVERY DAY FOR BLOOD PRESSURE AND FLUID RETENTION 12/01/17  Yes Lucky Cowboy, MD  levothyroxine (SYNTHROID, LEVOTHROID) 112 MCG tablet TAKE 1 TABLET BY MOUTH EVERY MORNING BEFORE BREAKFAST ON AN EMPTY STOMACH 08/21/17  Yes Lucky Cowboy, MD  losartan (COZAAR) 100 MG tablet TAKE 1 TABLET BY MOUTH EVERY DAY 07/03/17  Yes Judd Gaudier, NP  Magnesium 500 MG TABS Take 500 mg by mouth 2 (two) times daily.    Yes [provider]  methotrexate (RHEUMATREX) 2.5 MG tablet Take by mouth. Takes 8 pills on Fridays   Yes [provider]  montelukast (SINGULAIR) 10 MG tablet TAKE 1 TABLET(10 MG) BY MOUTH DAILY 09/25/17  Yes Lucky Cowboy, MD  Omega-3 Fatty Acids (FISH OIL) 1000 MG CPDR Take 1,000 mg by mouth 2 (two) times daily.    Yes [provider]  triamcinolone cream (KENALOG) 0.5 % Apply 1 application topically 2 (two) times daily. Patient taking differently: Apply 1 application topically as needed.  07/20/16  Yes Quentin Mulling, PA-C  glucose blood (FREESTYLE LITE) test strip Test Blood Sugar one time daily-DX-R73.09 02/07/17   Lucky Cowboy, MD  Lancets (ACCU-CHEK SOFT [facility]) lancets Check blood sugar 1 time daily-DX-R73.03 02/14/17   Lucky Cowboy, MD   Allergies  Allergen Reactions  . Influenza Vaccines Swelling    Patient states her arms swells up sometimes but she tolerates flu vaccine if given with benadryl  . Ace Inhibitors Cough    Other reaction(s): Other (See Comments) Made her cough    FAMILY HISTORY:  family history includes Heart attack in her mother; Hypertension in her mother; Lymphoma in her mother. SOCIAL HISTORY:  reports that she quit smoking about 29 years ago. She has never used smokeless tobacco. She reports that she does not drink alcohol or use drugs.  REVIEW OF SYSTEMS:  ( unable to obtain pt is intubated acute encephalopathy s/p cardiac arrest) Constitutional: Negative for fever, chills, weight loss, malaise/fatigue and diaphoresis.  HENT: Negative for hearing loss, ear pain, nosebleeds, congestion,  sore throat, neck pain, tinnitus and ear discharge.   Eyes: Negative for blurred vision, double vision, photophobia, pain, discharge and redness.  Respiratory: Negative for cough, hemoptysis, sputum production, shortness of breath, wheezing and stridor.   Cardiovascular: Negative for chest pain, palpitations, orthopnea, claudication, leg swelling and PND.  Gastrointestinal: Negative for heartburn, nausea, vomiting, abdominal pain, diarrhea, constipation, blood in stool and melena.  Genitourinary: Negative for dysuria, urgency, frequency, hematuria and flank pain.  Musculoskeletal: Negative for myalgias, back pain, joint pain and falls.  Skin: Negative for itching and rash.  Neurological: Negative for dizziness, tingling, tremors, sensory change, speech change, focal weakness, seizures, loss of consciousness, weakness and headaches.  Endo/Heme/Allergies: Negative for environmental allergies and polydipsia. Does not bruise/bleed easily.  SUBJECTIVE:   VITAL SIGNS: Temp:  [96.6 F (35.9 C)-98.4 F (36.9 C)] 96.6 F (35.9 C) (07/16 0550) Pulse Rate:  [31-124] 120 (07/16 0550) Resp:  [10-22] 20 (07/16 0550) BP: (41-155)/(10-114) 110/42 (07/16 0541) SpO2:  [77 %-100 %] 100 % (07/16 0550) Arterial Line BP: (85-114)/(29-40) 104/39 (07/16 0550) FiO2 (%):  [60 %-100 %] 60 % (07/16 0541) Weight:  [85.8 kg (189 lb 3.2 oz)] 85.8 kg (189 lb 3.2 oz) (07/15 1610)  PHYSICAL EXAMINATION: General: not awake does not respond to verbal stimuli Neuro:  PERRLA,  HEENT:  NCAT ETT in oropharynx Cardiovascular:  S1 and S2 appreciated  Lungs:  Coarse breath sounds b/l Abdomen:  Soft non tender + BS Musculoskeletal:  No gross deformities,   edema Skin:  Grossly intact  Recent Labs  Lab 12-30-17 1014 12/04/2017 0305 12/13/2017 0527  NA 136 138 138  K 3.5 3.2* 3.2*  CL 98 98 101  CO2 25 30  --   BUN 15 16 11   CREATININE 0.93 0.92 1.00  GLUCOSE 179* 154* 327*   Recent Labs  Lab 2017/12/30 1014  11/30/2017 0305 12/20/2017 0527  HGB 14.0 13.5 10.9*  HCT 42.6 41.9 32.0*  WBC 7.2 11.0*  --   PLT 246 247  --    Dg Chest 2 View  Result Date: 30-Dec-2017 CLINICAL DATA:  77 year old female with history of chest pain with radiation into the arms bilaterally. EXAM: CHEST - 2 VIEW COMPARISON:  Chest x-ray 06/02/2014. FINDINGS: Lung volumes are normal. No consolidative airspace disease. No pleural effusions. No pneumothorax. No pulmonary nodule or mass noted. Pulmonary vasculature and the cardiomediastinal silhouette are within normal limits. Aortic atherosclerosis IMPRESSION: 1.  No radiographic evidence of acute cardiopulmonary disease. 2. Aortic atherosclerosis. Electronically Signed   By: Trudie Reed M.D.   On: December 30, 2017 10:40   Dg Chest Port 1 View  Result Date: 12/16/2017 CLINICAL DATA:  77 year old female with respiratory failure. Status post intubation. EXAM: PORTABLE CHEST 1 VIEW COMPARISON:  Chest radiograph dated 12-30-2017 FINDINGS: There has been interval placement of an endotracheal tube with tip approximately 4 cm above the carina. Enteric tube extends into the left hemiabdomen with tip beyond the inferior margin of the image. There has been interval development of an area of airspace opacity at the left lung base which may represent combination of pleural effusion and atelectasis/infiltrate. Left upper lobe vascular and interstitial prominence may represent asymmetric edema. The right lung is clear. No pneumothorax. Stable cardiac silhouette. No acute osseous pathology. IMPRESSION: 1. Endotracheal tube above the carina and enteric tube extends into the left hemiabdomen. 2. Interval development of left lung base opacity likely combination of infiltrate/atelectasis and pleural effusion. Asymmetric left upper lobe edema. Electronically Signed   By: Elgie Collard M.D.   On: 12/04/2017 05:41   Ct Angio Chest/abd/pel For Dissection W And/or W/wo  Result Date: 12/30/2017 CLINICAL DATA:   77 year old female with history of chest pain and radiating into the arms bilaterally since 1 a.m. this morning. EXAM: CT ANGIOGRAPHY CHEST, ABDOMEN AND PELVIS TECHNIQUE: Multidetector CT imaging through the chest, abdomen and pelvis was performed using the standard protocol during bolus administration of intravenous contrast. Multiplanar reconstructed images and MIPs were obtained and reviewed to evaluate the vascular anatomy. CONTRAST:  ISOVUE-370 IOPAMIDOL (ISOVUE-370) INJECTION 76% COMPARISON:  No priors. FINDINGS: CTA CHEST FINDINGS Cardiovascular: No crescentic high attenuation noted associated with the thoracic aorta to suggest acute intramural hemorrhage. No evidence of thoracic aortic aneurysm or dissection on CTA images. Heart size is normal. There is no significant pericardial fluid, thickening or pericardial calcification. There is aortic atherosclerosis, as well as atherosclerosis of the great vessels of the mediastinum and the coronary arteries, including calcified atherosclerotic plaque in the left main, left anterior descending, left circumflex and right coronary arteries. Mild calcifications of the aortic valve. Mediastinum/Nodes: No pathologically enlarged mediastinal or hilar lymph nodes. Small hiatal hernia. No axillary lymphadenopathy. Lungs/Pleura: 11 x 9 mm right upper lobe nodule (axial image 60 of series 9) with slightly ill-defined margins. No acute  consolidative airspace disease. No pleural effusions. Linear areas of scarring and/or atelectasis in the anterior aspect of the left lower lobe and medial segment of the right middle lobe. Musculoskeletal: There are no aggressive appearing lytic or blastic lesions noted in the visualized portions of the skeleton. Review of the MIP images confirms the above findings. CTA ABDOMEN AND PELVIS FINDINGS VASCULAR Aorta: Normal caliber aorta without aneurysm, dissection, vasculitis or significant stenosis. Celiac: Patent without evidence of  aneurysm, dissection, vasculitis or significant stenosis. SMA: Patent without evidence of aneurysm, dissection, vasculitis or significant stenosis. Renals: Both renal arteries are patent without evidence of aneurysm, dissection, vasculitis, fibromuscular dysplasia or significant stenosis. IMA: Patent without evidence of aneurysm, dissection, vasculitis or significant stenosis. Inflow: Patent without evidence of aneurysm, dissection, vasculitis or significant stenosis. Veins: No obvious venous abnormality within the limitations of this arterial phase study. Review of the MIP images confirms the above findings. NON-VASCULAR Hepatobiliary: No suspicious cystic or solid hepatic lesions. No intra or extrahepatic biliary ductal dilatation. Status post cholecystectomy. Pancreas: No pancreatic mass. No pancreatic ductal dilatation. No pancreatic or peripancreatic fluid or inflammatory changes. Spleen: Unremarkable. Adrenals/Urinary Tract: 2.5 cm simple cyst in the interpolar region of the right kidney. Left kidney and left adrenal gland are normal in appearance. Large partially calcified right adrenal mass which has internal areas of low attenuation (3 HU) measuring 4.4 x 3.2 cm, most compatible with a large adenoma. No hydroureteronephrosis. Urinary bladder is normal in appearance. Stomach/Bowel: Normal appearance of the stomach. No pathologic dilatation of small bowel or colon. 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 The appendix is not confidently identified and may be surgically absent. Regardless, there are no inflammatory changes noted adjacent to the cecum to suggest the presence of an acute appendicitis at this time. Lymphatic: Mildly enlarged para-aortic lymph node in the left retroperitoneum measuring 12 mm in short axis (axial image 211 of series 8). No other lymphadenopathy is noted  elsewhere in the abdomen or pelvis. Reproductive: Uterus and ovaries are unremarkable in appearance. Other: No significant volume of ascites.  No pneumoperitoneum. Musculoskeletal: There are no aggressive appearing lytic or blastic lesions noted in the visualized portions of the skeleton. Review of the MIP images confirms the above findings. IMPRESSION: 1. No acute findings are noted in the chest, abdomen or pelvis to account for the patient's symptoms. Specifically, no evidence of acute aortic syndrome. 2. 11 x 9 mm right upper lobe nodule which has slightly ill-defined margins. The possibility of primary bronchogenic neoplasm should be considered. Consider one of the following in 3 months for both low-risk and high-risk individuals: (a) repeat chest CT or (b) follow-up PET-CT. This recommendation follows the consensus statement: Guidelines for Management of Incidental Pulmonary Nodules Detected on CT Images: From the Fleischner Society 2017; Radiology 2017; 284:228-243. 3. Mildly enlarged left para-aortic lymph node measuring 12 mm in short axis. This is nonspecific, but attention is is recommended on any future imaging studies to ensure the stability of this finding. 4. Large right adrenal adenoma. 5. Aortic atherosclerosis, in addition to left main and 3 vessel coronary artery disease. Assessment for potential risk factor modification, dietary therapy or pharmacologic therapy may be warranted, if clinically indicated. 6. There are calcifications of the aortic valve. Echocardiographic correlation for evaluation of potential valvular dysfunction may be warranted if clinically indicated. 7. Additional incidental findings, as above. Electronically Signed   By: Reuel Boom  Entrikin M.D.   On: 2017/12/22 11:42    ASSESSMENT / PLAN: NEURO: Acute encephalopathy following PEA arrest 20 mins Intact gag and pupils but no eye opening or response to noxious stimuli No signs of seizure activity RASS goal 0 to -1 Sedation  per protocol Currently not requiring sedation -continue neurochecks   CARDIAC: Cardiogenic Shock vs Hypotension induced by Nitrogylcerin ggt Acute coronary thrombosis Prev Unstable Angina--> NSTEMI-->Evolving Inferior MI Is this a new occlusion superimposed on previous midcircumflex? Continues on epi @ 20 Dopa @5 -> receiving through a peripheral line  Continue Heparin ggt for anticaogulation Intravascular hemodynamic monitoring with A- line Defer additional mgmt to cardiology who is following  Recent Labs    Dec 22, 2017 1409 2017-12-22 2128 12/08/2017 0305  TROPONINI 0.37* 3.96* 8.58*  trend troponins and EKGs  PULMONARY: Acute Hypoxic Respiratory Failure Intubated and continues on PRVC TV 8 cc/kg Reviewed ABG: Metabolic acidosis FiO2 decreased from 100% to 60% CXR post intubation completed and reviewed Hold weaning at this time- observe for neurologic recovery   ID: No signs of active infection Trend WBC, fever curve and LA  Endocrine: Prediabetes per chart Check Hgb A1c Phase 1 ICU glycemic protocol Check TSH and cortisol levels  GI: No acute issue at this time Keep NPO Hold off on TF unless pt remains intubated for >24 hrs Prophylaxis with Pepcid aceptable  Heme: Anemia- baseline on presentation 13 Currently Hgb 10 Plts 184 Given ACS h/o Hgb<8 transfuse PRBCs Type and cross No current signs of active bleeding Continues on Heparin ggt given NSTEMI--> STEMI SCDs   RENAL Acute Kidney injury GR 34 on last BMET Avoid nephrotoxic meds Anion Gap Metabolic acidosis - most likely lactic acidosis- received some bicarb during code and post Repeat ABG pending.  Lab Results  Component Value Date   CREATININE 1.00 12/05/2017   CREATININE 0.92 12/23/2017   CREATININE 0.93 2017/12/22  indwelling foley catheter in acute critical illness for 1st 24 hrs Hypokalemic- will replete Mg goal 2, Phos goal 2 K= goal 4    I, Dr Newell Coral have personally reviewed  patient's available data, including medical history, events of note, physical examination and test results as part of my evaluation. I have discussed with PA Celine Mans and other care providers such as pharmacist, RN and RRT. The patient is critically ill with multiple organ systems failure and requires high complexity decision making for assessment and support, frequent evaluation and titration of therapies, application of advanced monitoring technologies and extensive interpretation of multiple databases. Critical Care Time devoted to patient care services described in this note is 63 Minutes. This time reflects time of care of this signee Dr Newell Coral. This critical care time does not reflect procedure time, or teaching time or supervisory time of PA Celine Mans but could involve care discussion time    DISPOSITION:ICU- 2H CC TIME: 63 mins PROGNOSIS:Poor CODE STATUS: DNR FAMILY:at bedside are two sons and daughter in law. We had a comprehensive conversation regarding current clinical condition, associated morbidity and mortality.  The patient has an advanced directive that indicates that she is DNR and in the event that there are no further interventions that can be provided she would like to be made comfortable. Paperwork also indicates that sons are her healthcare POA.    Signed Dr Newell Coral Pulmonary Critical Care Locums  11/27/2017, 6:00 AM

## 2017-12-27 NOTE — Progress Notes (Signed)
CRITICAL VALUE ALERT  Critical Value:  Lactic acid 10.4  Date & Time Notied: 12/06/2017  Provider Notified: NP Canary Brim  Orders Received/Actions taken: No new orders at this time

## 2017-12-27 NOTE — Progress Notes (Signed)
While in patients room noted patient to have a change of rhythm from ST to SB following flattening of arterial line. MD and NP at bedside. Family notified and at bedside with patient. Epi & Levophed gtt turned off.

## 2017-12-27 NOTE — Progress Notes (Signed)
Patient passed away at 1129. Two RN's verified. MD notified. Family at bedside. Comfort given to family, and chaplain offered family declined at this time.

## 2017-12-27 NOTE — Progress Notes (Signed)
CODE BLUE event note --> Transfer to ICU  77 year old patient with cath today showing heavily calcified multivessel coronary disease in mid left circumflex occlusion which was medically managed.  Around 3:15 AM, I was notified by primary nurse that the patient has very low blood pressure, unable to read and unresponsive.  Upon arrival to room, she is dusky and diaphoretic and cool.  Blood pressure with maps in the 40s intermittently also unable to read accurately.  Thready femoral pulse.  ECG was obtained showing normal sinus rhythm with diffuse ST elevations more prominently inferiorly and anterioly. patient unresponsive to sternal rub and unable to protect airway.  We felt that this represented a diffusely ischemic pattern from hypotension and hypoxia.  Goal at this point was to stabilize, repeat ECG and determine next steps.  We proceeded with starting fluid boluses, dopamine and called anesthesia to intubate for airway protection.  While waiting for intubation, pulse became more thready and we provided 1 amp of epinephrine.  We proceeded to intubate and post intubation heart rate slowed to PEA despite phenylephrine bolus and increasing pressor requirements.  She subsequently underwent 20 minutes worth of CPR requiring many amps of epinephrine and bicarb.  Potassium & magnesium were replaced.  After 20 minutes she regained a pulse.  On monitor ECG now with widened QRS.  We transferred her to ICU.  Repeat ECG with inferior posterior injury pattern.  At this point, she is not really responding to pain.  She does have intact pupillary response and a gag reflex.  She is on epi and dopamine and heparin drip.  Fluids have been given.  I performed a limited bedside ultrasound which did not show any effusion to suggest free wall rupture.  Mitral apparatus appears intact.  There is inferior wall hypokinesis.  I discussed extensively with her 2 sons and interventional on-call team.  Unclear what precipitated this  event, possibly hypotension from IV nitroglycerin in the setting of evolving inferior MI versus new occlusion versus other noncardiac event such as a stroke.  At this point, plan to observe neurologic function post prolonged resuscitation and hypotension.  Family brought in living well, we discussed her wishes which in general would be to avoid invasive procedures which would not provide clear quality of life benefits.    Given all these findings and her wishes, we will continue to medically support and observe for neurologic recovery which will help guide decisions on future invasive procedures, if indicated.  Labs are ordered.  Arterial line has just been placed.  Central line to be placed by critical care team who have been consulted.  Cleta Alberts, MD Cardiology   CRITICAL CARE Performed by: Greig Castilla Means   Total critical care time: 120 minutes  Critical care time was exclusive of separately billable procedures and treating other patients.  Critical care was necessary to treat or prevent imminent or life-threatening deterioration.  Critical care was time spent personally by me on the following activities: development of treatment plan with patient and/or surrogate as well as nursing, discussions with consultants, evaluation of patient's response to treatment, examination of patient, obtaining history from patient or surrogate, ordering and performing treatments and interventions, ordering and review of laboratory studies, ordering and review of radiographic studies, pulse oximetry and re-evaluation of patient's condition.

## 2017-12-27 NOTE — Progress Notes (Signed)
At around 3am  RN noted pt BP to be lower than norm (76/25 from 141/85). Nitro drip stopped and RN proceeded to try to arouse pt w/ no response to voice or pain stimuli. BP rechecked on other extremities with low BP readings (see flowsheet).  Provider paged and made aware and en route, rapid called and at bedside. Pt family called and made aware per previous note.

## 2017-12-27 NOTE — Procedures (Signed)
Arterial Catheter Insertion Procedure Note Angelica Parker [id number] [birth year]  Procedure: Insertion of Arterial Catheter  Indications: Blood pressure monitoring and Frequent blood sampling  Procedure Details Consent: Risks of procedure as well as the alternatives and risks of each were explained to the (patient/caregiver).  Consent for procedure obtained. Time Out: Verified patient identification, verified procedure, site/side was marked, verified correct patient position, special equipment/implants available, medications/allergies/relevent history reviewed, required imaging and test results available.  0516  Maximum sterile technique was used including antiseptics, cap, gloves, gown, hand hygiene, mask and sheet. Skin prep: Chlorhexidine; local anesthetic administered 22 gauge catheter was inserted into left radial artery using the Seldinger technique. ULTRASOUND GUIDANCE USED: NO Evaluation Blood flow good; BP tracing good. Complications: No apparent complications.   Angelica Parker Setting 12/22/2017

## 2017-12-27 NOTE — Progress Notes (Signed)
Holding any sedation unless pt shows signs of discomfort per Dr. Carlota Raspberry.

## 2017-12-27 NOTE — Code Documentation (Signed)
  Patient Name: Angelica Parker   MRN: [id number]   Date of Birth/ Sex: Feb 09, 1941 , female      Admission Date: 12/03/2017  Attending Provider: Chrystie Nose, MD  Primary Diagnosis: Non-ST elevation (NSTEMI) myocardial infarction [facility])   Indication: Pt was in her usual state of health until this AM, when she was noted to be unresponsive and did not have pulses. Code blue was subsequently called. At the time of arrival on scene, ACLS protocol was underway.   Technical Description:  - CPR performance duration:  20 minutes  - Was defibrillation or cardioversion used? No   - Was external pacer placed? Yes  - Was patient intubated pre/post CPR? Yes   Medications Administered: Y = Yes; Blank = No Amiodarone    Atropine    Calcium    Epinephrine  Y  Lidocaine    Magnesium  Y  Norepinephrine    Phenylephrine    Sodium bicarbonate  Y  Vasopressin     Post CPR evaluation:  - Final Status - Was patient successfully resuscitated ? Yes - What is current rhythm? Sinus tachycardia - What is current hemodynamic status? Stable  Miscellaneous Information:  - Labs sent, including: None  - Primary team notified?  Yes  - Family Notified? Yes  - Additional notes/ transfer status:  Patient transferred to ICU for further care.     Claudean Severance, MD  12/15/2017, 4:43 AM

## 2017-12-27 NOTE — Progress Notes (Signed)
Family at bedside. 

## 2017-12-27 NOTE — Progress Notes (Signed)
FiO2 increased from 60% to 90% per respiratory therapy s/p AM ABG pO2 77.

## 2017-12-27 NOTE — Anesthesia Procedure Notes (Signed)
Procedure Name: Intubation Date/Time: 12/12/17 4:00 AM Performed by: Molli Hazard, CRNA Pre-anesthesia Checklist: Patient identified, Emergency Drugs available, Suction available and Patient being monitored Patient Re-evaluated:Patient Re-evaluated prior to induction Oxygen Delivery Method: Ambu bag Preoxygenation: Pre-oxygenation with 100% oxygen Induction Type: IV induction, Rapid sequence and Cricoid Pressure applied Laryngoscope Size: Miller and 2 Grade View: Grade I Tube type: Subglottic suction tube Tube size: 7.5 mm Number of attempts: 1 Airway Equipment and Method: Stylet Placement Confirmation: ETT inserted through vocal cords under direct vision,  CO2 detector and breath sounds checked- equal and bilateral Secured at: 22 cm Tube secured with: Tape Dental Injury: Teeth and Oropharynx as per pre-operative assessment

## 2017-12-27 NOTE — Progress Notes (Signed)
Echocardiogram 2D Echocardiogram has been performed.  Merritt Kibby G Jancarlo Biermann 12/16/2017, 11:12 AM

## 2017-12-27 DEATH — deceased

## 2018-04-12 ENCOUNTER — Ambulatory Visit: Payer: Self-pay | Admitting: Physician Assistant

## 2018-08-14 ENCOUNTER — Encounter: Payer: Self-pay | Admitting: Physician Assistant

## 2019-12-01 IMAGING — CR DG KNEE 1-2V*L*
2 series · 2 of 2 positions shown · non-contrast
Comparison: 05/30/2016

CLINICAL DATA: Left knee pain.

EXAM:
LEFT KNEE - 1-2 VIEW

[t knee lat left]
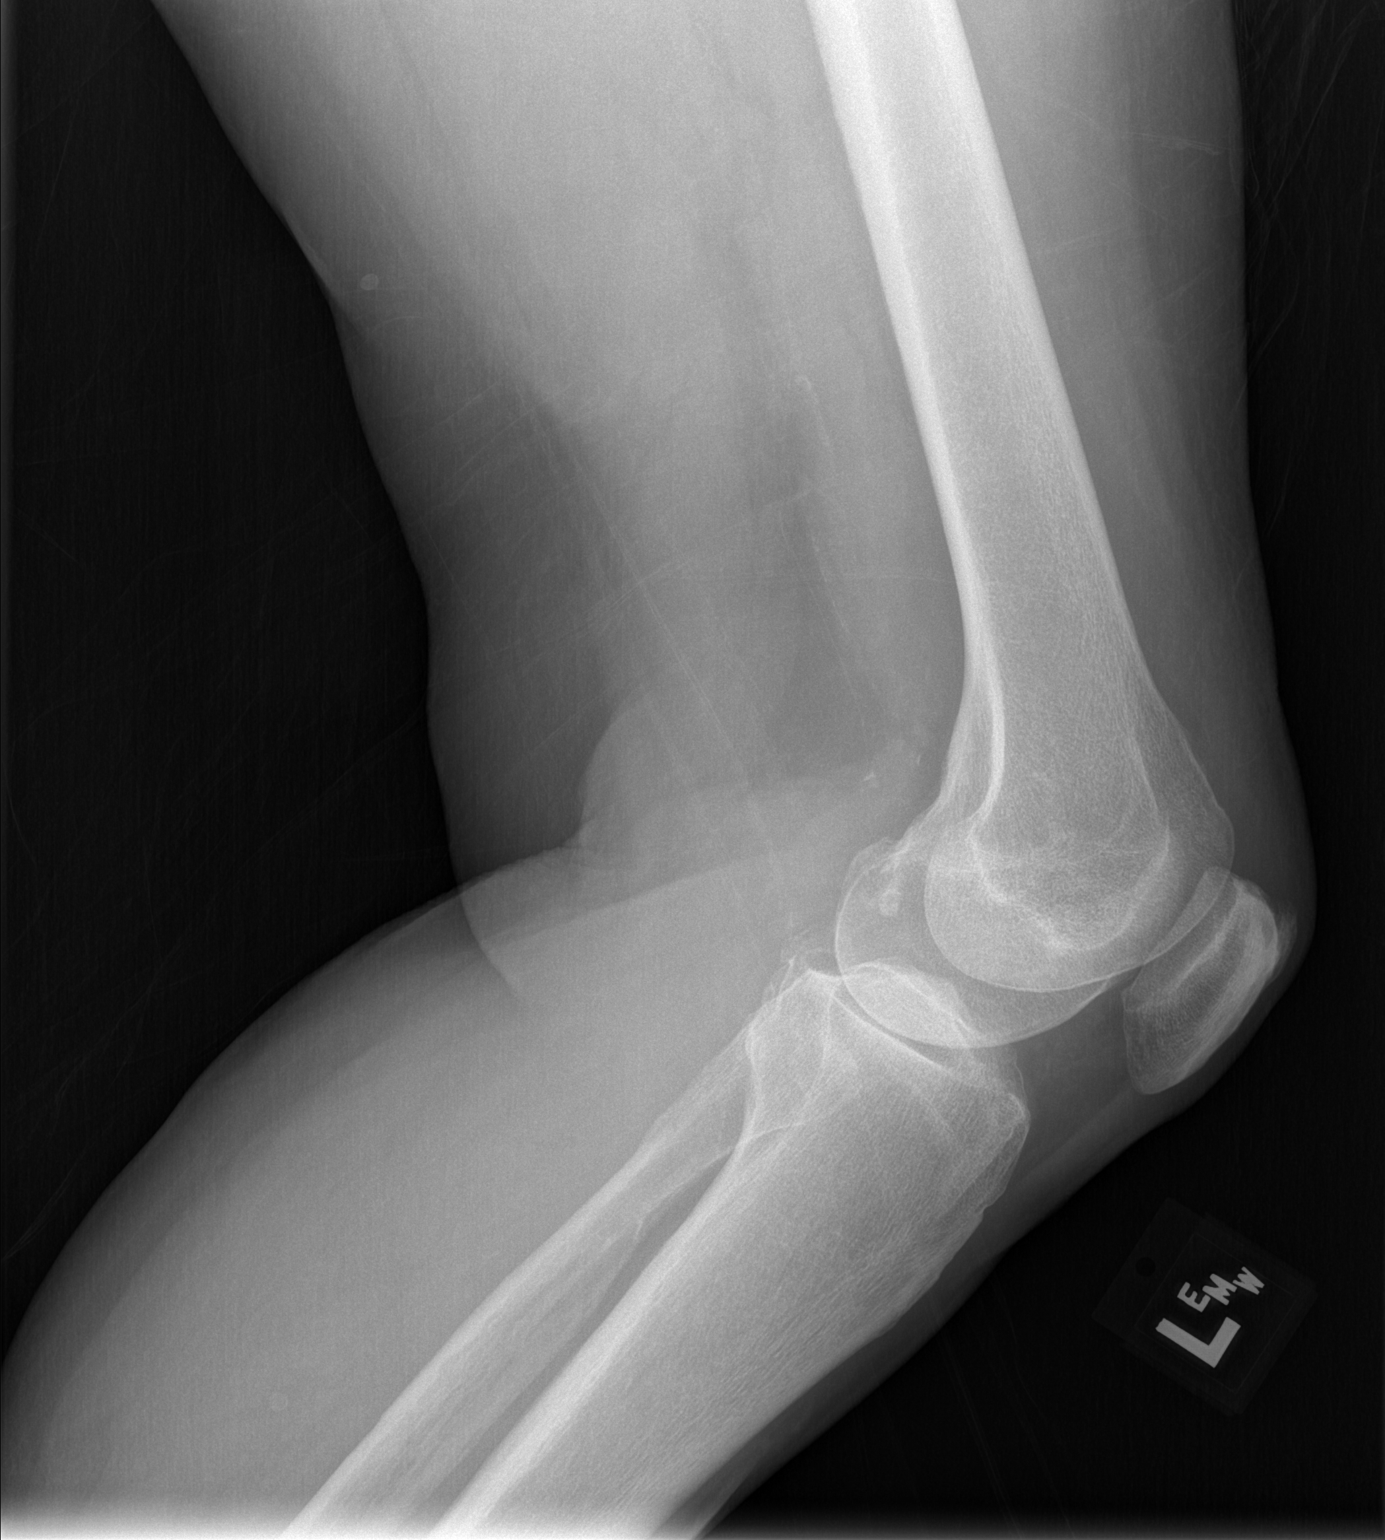

[t knee ap left]
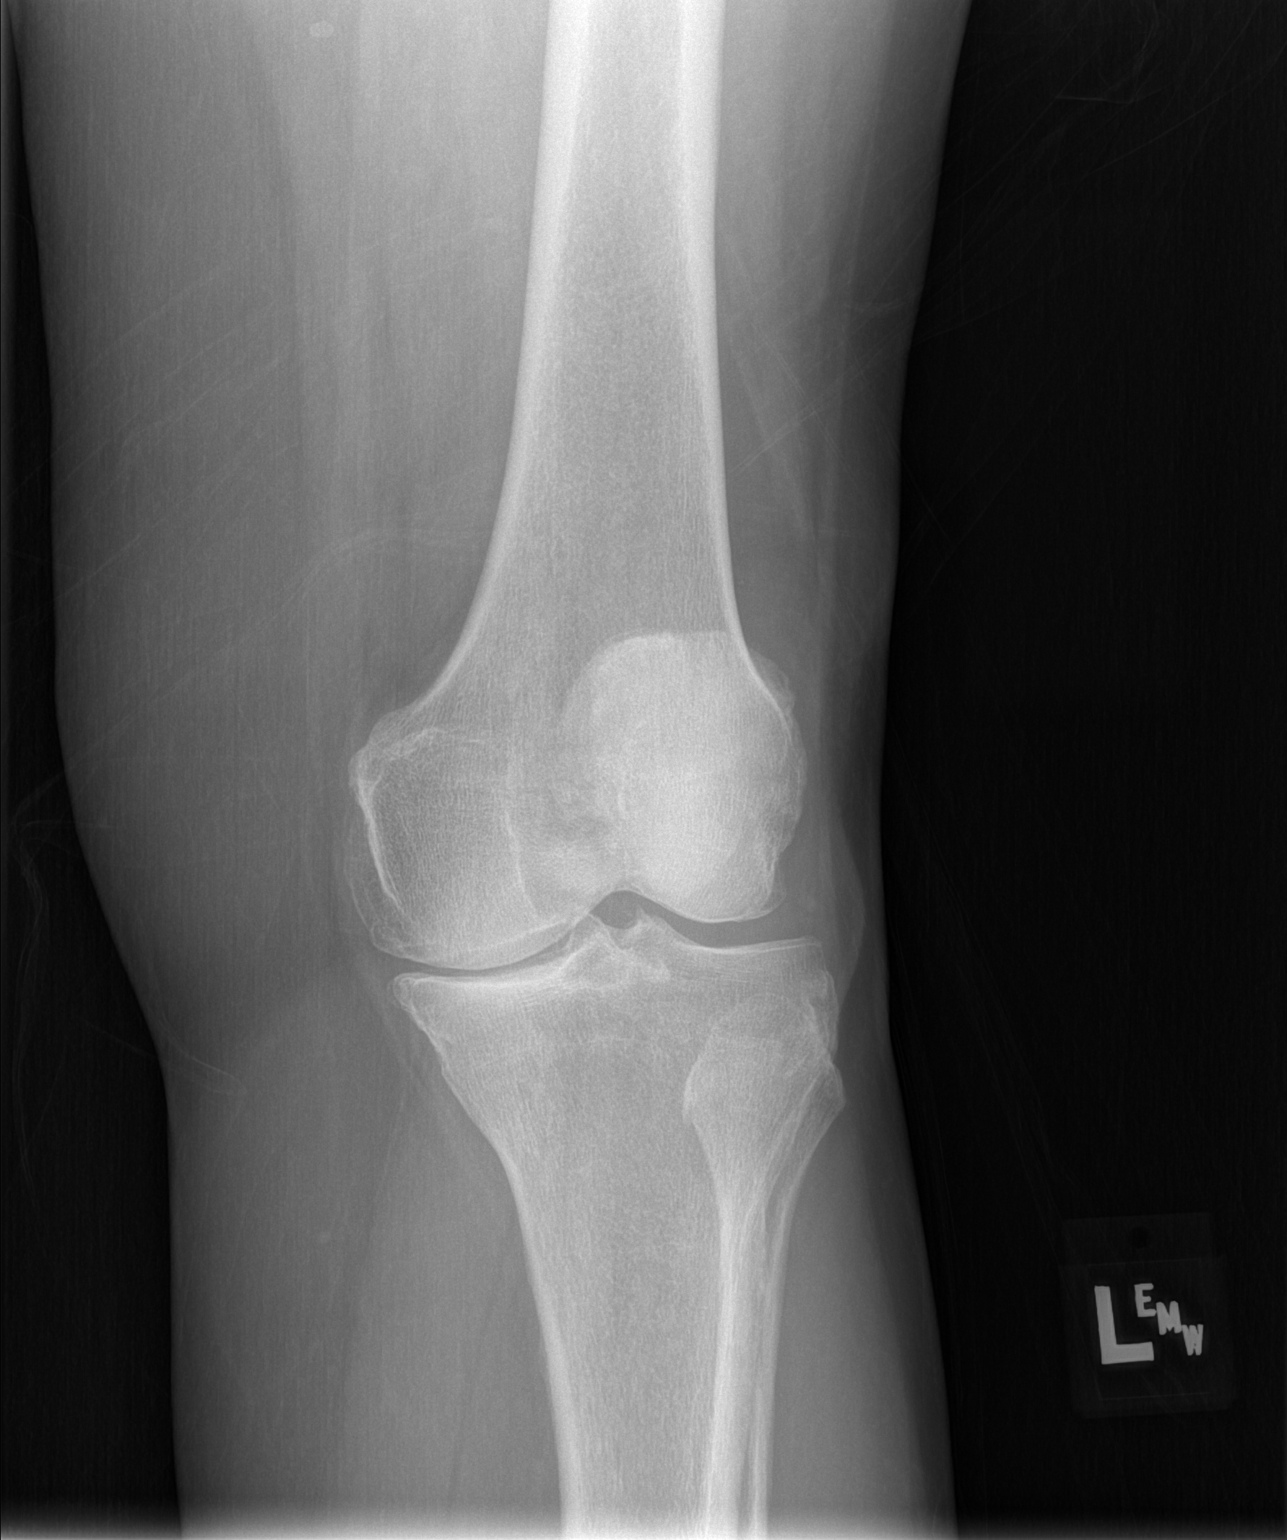

[2 of 2 positions shown; findings below may reference images not displayed]

FINDINGS: Tricompartmental degenerative changes most significant in the medial
compartment with joint space narrowing and osteophytic spurring. No
fracture or osteochondral lesion. No chondrocalcinosis. No definite
joint effusion. Vascular calcifications are noted.
IMPRESSION: Tricompartmental degenerative changes most significant in the medial
compartment.

No acute bony findings or joint effusion.

## 2020-06-07 IMAGING — DX DG CHEST 1V PORT
1 series · 1 of 1 positions shown · non-contrast
Comparison: 12/11/2017.

CLINICAL DATA: Central line placement.

EXAM:
PORTABLE CHEST 1 VIEW

[chest]
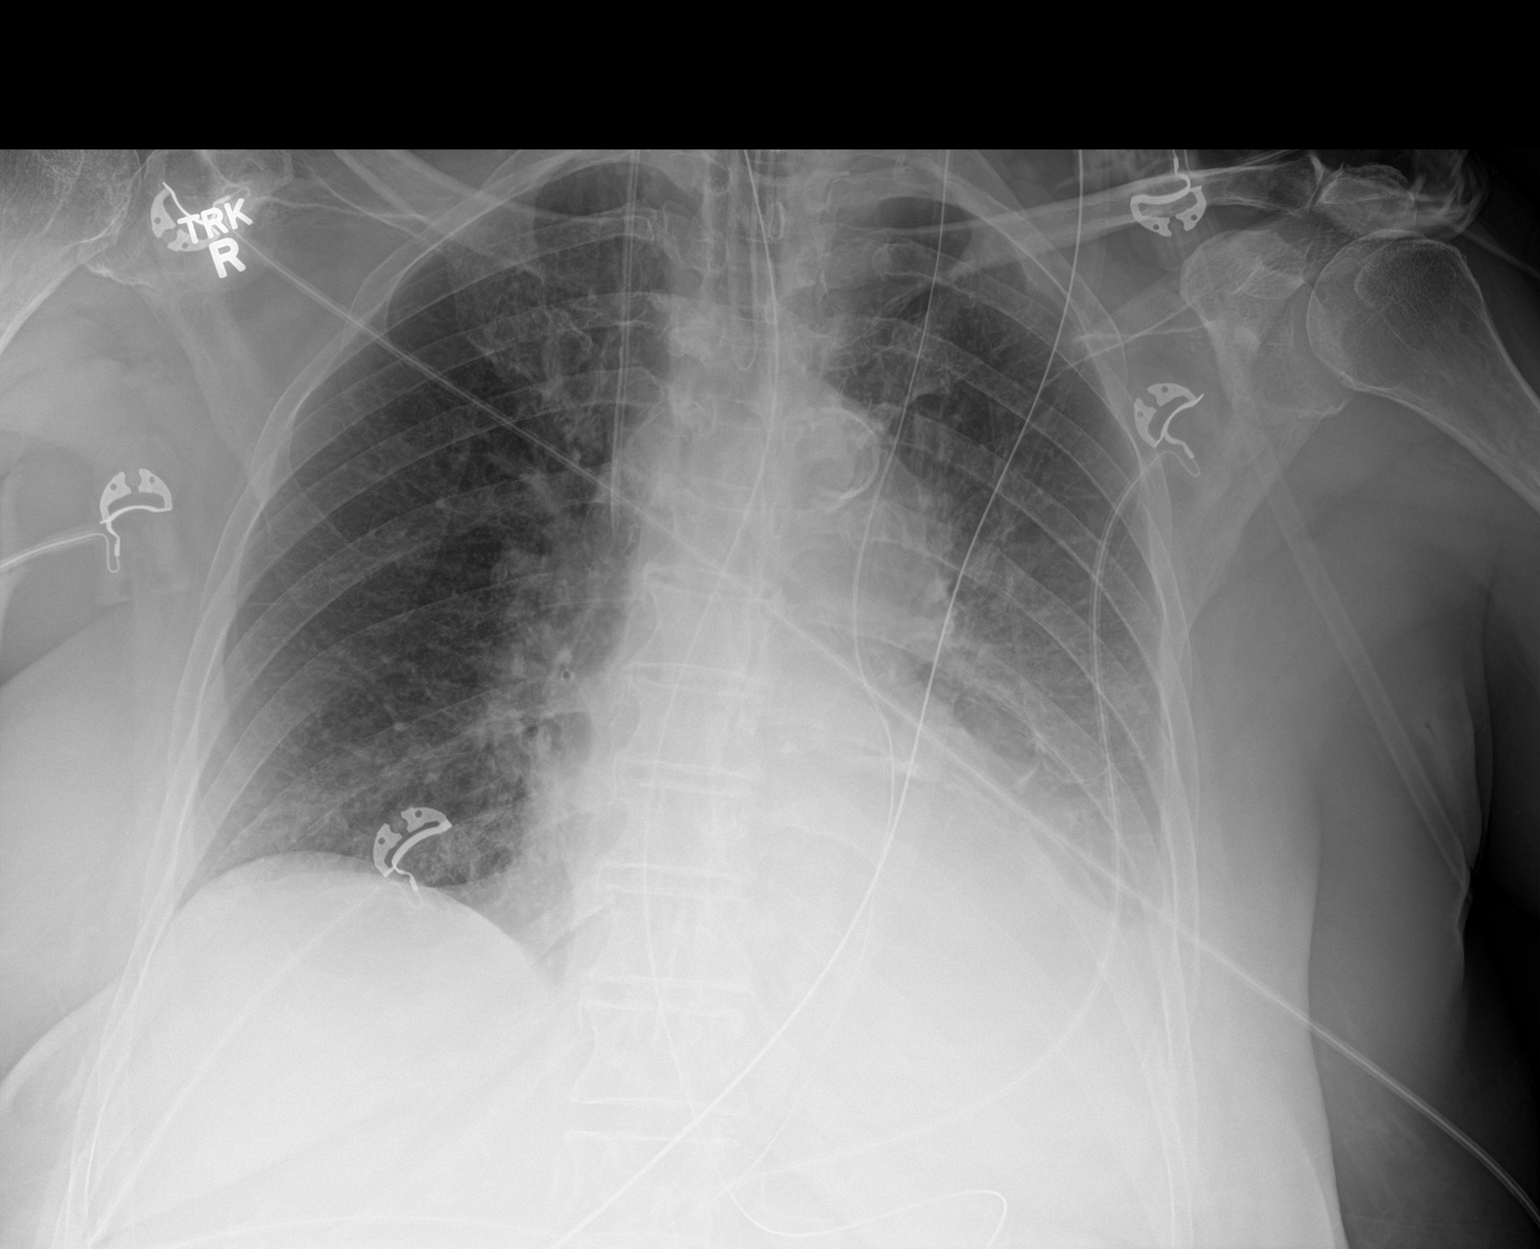

[1 of 1 positions shown; findings below may reference images not displayed]

FINDINGS: Right IJ line noted with tip over superior vena cava. Endotracheal
tube, NG tube in stable position. Stable cardiomegaly. Persistent
left-sided infiltrate/edema and left-sided pleural effusion. No
interim change. No pneumothorax.
IMPRESSION: 1. Right IJ line noted with tip over superior vena cava.
Endotracheal tube and NG tube in stable position.

2. Persistent left-sided infiltrate/edema and left-sided pleural
effusion. No interim change.

3.  Persistent cardiomegaly.  No interim change
